# Patient Record
Sex: Female | Born: 1995 | Race: Black or African American | Hispanic: No | Marital: Single | Smoking: Former smoker
Health system: Southern US, Community
[De-identification: ages and names within clinical notes are randomized; demographics above are authoritative.]

## PROBLEM LIST (undated history)

## (undated) DIAGNOSIS — D649 Anemia, unspecified: Secondary | ICD-10-CM

## (undated) DIAGNOSIS — Z87440 Personal history of urinary (tract) infections: Secondary | ICD-10-CM

## (undated) HISTORY — DX: Personal history of urinary (tract) infections: Z87.440

## (undated) HISTORY — DX: Anemia, unspecified: D64.9

## (undated) HISTORY — PX: OTHER SURGICAL HISTORY: SHX169

---
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
## 2017-02-11 ENCOUNTER — Encounter: Payer: Self-pay | Admitting: *Deleted

## 2017-02-11 ENCOUNTER — Emergency Department
Admission: EM | Admit: 2017-02-11 | Discharge: 2017-02-11 | Disposition: A | Discharge status: Home / Self Care | Payer: Self-pay | Attending: Emergency Medicine | Admitting: Emergency Medicine

## 2017-02-11 DIAGNOSIS — R197 Diarrhea, unspecified: Secondary | ICD-10-CM | POA: Insufficient documentation

## 2017-02-11 DIAGNOSIS — R112 Nausea with vomiting, unspecified: Secondary | ICD-10-CM | POA: Insufficient documentation

## 2017-02-11 DIAGNOSIS — J069 Acute upper respiratory infection, unspecified: Secondary | ICD-10-CM | POA: Insufficient documentation

## 2017-02-11 DIAGNOSIS — Z3A00 Weeks of gestation of pregnancy not specified: Secondary | ICD-10-CM | POA: Insufficient documentation

## 2017-02-11 DIAGNOSIS — O99519 Diseases of the respiratory system complicating pregnancy, unspecified trimester: Secondary | ICD-10-CM | POA: Insufficient documentation

## 2017-02-11 MED ORDER — GUAIFENESIN ER 600 MG PO TB12: 1200.0000 mg | ORAL_TABLET | Freq: Two times a day (BID) | ORAL | 0 refills | Status: DC

## 2017-02-11 MED ORDER — DIPHENHYDRAMINE HCL 25 MG PO CAPS: 25.0000 mg | ORAL_CAPSULE | ORAL | 2 refills | Status: DC | PRN

## 2017-02-11 NOTE — ED Triage Notes (Signed)
Pt states nasal congestion, left ear pain and sore throat for 2 days, states she is pregnant, unsure of how long that her test was just positive, denies any pregnancy complaints of bleeding, states "I am just here for my cold", awake and alert in no acute distress

## 2017-02-11 NOTE — ED Provider Notes (Signed)
[facility] Emergency Department Provider Note  ____________________________________________  Time seen: Approximately 2:48 PM  I have reviewed the triage vital signs and the nursing notes.   HISTORY  Chief Complaint Nasal Congestion and Otalgia   HPI Deborah Ferguson is a 21 y.o. female who presents to the emergency department for nasal congestion, left ear pain, sore throat, chills, dizziness, and headache with nausea and fatigue. She has had 4 episodes of vomiting and 8 episodes of diarrhea. She has taken Nyquil without relief. She is pregnant, but not sure how far along.   History reviewed. No pertinent past medical history.  There are no active problems to display for this patient.   No past surgical history on file.  Prior to Admission medications   Medication Sig Start Date End Date Taking? Authorizing Provider  diphenhydrAMINE (BENADRYL) 25 mg capsule Take 1 capsule (25 mg total) by mouth every 4 (four) hours as needed. 02/11/17 02/11/18  Braxten Memmer, Kasandra Knudsenari B, FNP  guaiFENesin (MUCINEX) 600 MG 12 hr tablet Take 2 tablets (1,200 mg total) by mouth 2 (two) times daily. 02/11/17   Chinita Pesterriplett, Dorreen Valiente B, FNP    Allergies Patient has no known allergies.  History reviewed. No pertinent family history.  Social History Social History  Substance Use Topics  . Smoking status: Not on file  . Smokeless tobacco: Not on file  . Alcohol use Not on file    Review of Systems Constitutional: Positive fever/chills ENT: Positive sore throat. Cardiovascular: Denies chest pain. Respiratory: Negative for shortness of breath. Negative for cough. Gastrointestinal: Positive for nausea,  Positive for vomiting.  Positive for diarrhea.  Musculoskeletal: Positive for body aches Skin: Negataive for rash. Neurological: Positive for headaches ____________________________________________   PHYSICAL EXAM:  VITAL SIGNS: ED Triage Vitals  Enc Vitals Group     BP 02/11/17  1409 102/87     Pulse Rate 02/11/17 1409 73     Resp 02/11/17 1409 16     Temp 02/11/17 1409 98.3 F (36.8 C)     Temp Source 02/11/17 1409 Oral     SpO2 02/11/17 1409 99 %     Weight 02/11/17 1406 120 lb (54.4 kg)     Height 02/11/17 1406 5\' 5"  (1.651 m)     Head Circumference --      Peak Flow --      Pain Score 02/11/17 1406 8     Pain Loc --      Pain Edu? --      Excl. in GC? --     Constitutional: Alert and oriented. Acutely ill appearing and in no acute distress. Eyes: Conjunctivae are normal. EOMI. Ears: Left TM is mildly erythematous and bulging, right TM is normal Nose: Maxillary sinus congestion noted; without rhinnorhea. Mouth/Throat: Mucous membranes are moist.  Oropharynx with cobblestone exudate. Tonsils without exudate.. Neck: No stridor.  Lymphatic: No cervical lymphadenopathy. Cardiovascular: Normal rate, regular rhythm. Good peripheral circulation. Respiratory: Normal respiratory effort.  No retractions. Breath sounds clear to auscultation.. Gastrointestinal: Soft and nontender.  Musculoskeletal: FROM x 4 extremities.  Neurologic:  Normal speech and language.  Skin:  Skin is warm, dry and intact. No rash noted. Psychiatric: Mood and affect are normal. Speech and behavior are normal.  ____________________________________________   LABS (all labs ordered are listed, but only abnormal results are displayed)  Labs Reviewed - No data to display ____________________________________________  EKG   ____________________________________________  RADIOLOGY  Not indicated. ____________________________________________   PROCEDURES  Procedure(s) performed: None  Critical Care performed: No ____________________________________________   INITIAL IMPRESSION / ASSESSMENT AND PLAN / ED COURSE  21 year old female who presents to the emergency department for evaluation of multiple medical complaints. She recently had a positive home pregnancy test, but has  not had a gynecology exam. She denies abdominal pain or vaginal bleeding at this time. She will be treated for her URI symptoms and advised to follow up with the health department. She was advised to take tylenol if needed for headache. She has been able to tolerate food and fluids today without vomiting. Vital signs and exam are reassuring. She was advised to return to the ER for symptoms that change or worsen if unable to schedule an appointment.  Pertinent labs & imaging results that were available during my care of the patient were reviewed by me and considered in my medical decision making (see chart for details).  New Prescriptions   DIPHENHYDRAMINE (BENADRYL) 25 MG CAPSULE    Take 1 capsule (25 mg total) by mouth every 4 (four) hours as needed.   GUAIFENESIN (MUCINEX) 600 MG 12 HR TABLET    Take 2 tablets (1,200 mg total) by mouth 2 (two) times daily.    If controlled substance prescribed during this visit, 12 month history viewed on the [facility] prior to issuing an initial prescription for Schedule II or III opiod. ____________________________________________   FINAL CLINICAL IMPRESSION(S) / ED DIAGNOSES  Final diagnoses:  Upper respiratory tract infection, unspecified type    Note:  This document was prepared using Dragon voice recognition software and may include unintentional dictation errors.     Chinita Pester, FNP 02/11/17 1534    Minna Antis, MD 02/15/17 1419

## 2017-04-07 DIAGNOSIS — Z91410 Personal history of adult physical and sexual abuse: Secondary | ICD-10-CM | POA: Insufficient documentation

## 2017-04-07 LAB — OB RESULTS CONSOLE HIV ANTIBODY (ROUTINE TESTING): HIV: NONREACTIVE

## 2017-04-07 LAB — HM PAP SMEAR

## 2017-04-08 ENCOUNTER — Other Ambulatory Visit: Payer: Self-pay | Admitting: Advanced Practice Midwife

## 2017-04-08 DIAGNOSIS — Z3482 Encounter for supervision of other normal pregnancy, second trimester: Secondary | ICD-10-CM

## 2017-04-09 LAB — OB RESULTS CONSOLE ANTIBODY SCREEN: ANTIBODY SCREEN: NEGATIVE

## 2017-04-09 LAB — OB RESULTS CONSOLE GC/CHLAMYDIA
CHLAMYDIA, DNA PROBE: NEGATIVE
Gonorrhea: NEGATIVE

## 2017-04-09 LAB — OB RESULTS CONSOLE RUBELLA ANTIBODY, IGM: RUBELLA: IMMUNE

## 2017-04-09 LAB — OB RESULTS CONSOLE ABO/RH: RH Type: POSITIVE

## 2017-04-09 LAB — OB RESULTS CONSOLE VARICELLA ZOSTER ANTIBODY, IGG: VARICELLA IGG: IMMUNE

## 2017-04-10 ENCOUNTER — Ambulatory Visit
Admission: RE | Admit: 2017-04-10 | Discharge: 2017-04-10 | Disposition: A | Discharge status: Home / Self Care | Payer: Medicaid Other | Source: Ambulatory Visit | Attending: Advanced Practice Midwife | Admitting: Advanced Practice Midwife

## 2017-04-10 ENCOUNTER — Other Ambulatory Visit: Payer: Self-pay | Admitting: Advanced Practice Midwife

## 2017-04-10 DIAGNOSIS — Z3482 Encounter for supervision of other normal pregnancy, second trimester: Secondary | ICD-10-CM | POA: Insufficient documentation

## 2017-04-10 DIAGNOSIS — Z3A13 13 weeks gestation of pregnancy: Secondary | ICD-10-CM | POA: Insufficient documentation

## 2017-04-21 DIAGNOSIS — R87619 Unspecified abnormal cytological findings in specimens from cervix uteri: Secondary | ICD-10-CM | POA: Insufficient documentation

## 2017-04-30 ENCOUNTER — Other Ambulatory Visit: Payer: Self-pay | Admitting: Obstetrics & Gynecology

## 2017-04-30 DIAGNOSIS — Z363 Encounter for antenatal screening for malformations: Secondary | ICD-10-CM

## 2017-05-20 ENCOUNTER — Ambulatory Visit (INDEPENDENT_AMBULATORY_CARE_PROVIDER_SITE_OTHER): Payer: Medicaid Other

## 2017-05-20 DIAGNOSIS — Z363 Encounter for antenatal screening for malformations: Secondary | ICD-10-CM

## 2017-05-25 ENCOUNTER — Other Ambulatory Visit: Payer: Self-pay | Admitting: Advanced Practice Midwife

## 2017-05-25 DIAGNOSIS — Z3689 Encounter for other specified antenatal screening: Secondary | ICD-10-CM

## 2017-06-04 ENCOUNTER — Ambulatory Visit: Payer: MEDICAID

## 2017-06-04 ENCOUNTER — Institutional Professional Consult (permissible substitution): Payer: Self-pay

## 2017-06-11 ENCOUNTER — Encounter: Payer: Self-pay | Admitting: *Deleted

## 2017-06-11 ENCOUNTER — Ambulatory Visit
Admission: RE | Admit: 2017-06-11 | Discharge: 2017-06-11 | Disposition: A | Discharge status: Home / Self Care | Payer: Medicaid Other | Source: Ambulatory Visit | Attending: Obstetrics and Gynecology | Admitting: Obstetrics and Gynecology

## 2017-06-11 DIAGNOSIS — Z3A22 22 weeks gestation of pregnancy: Secondary | ICD-10-CM | POA: Insufficient documentation

## 2017-06-11 DIAGNOSIS — O368920 Maternal care for other specified fetal problems, second trimester, not applicable or unspecified: Secondary | ICD-10-CM | POA: Diagnosis present

## 2017-06-11 DIAGNOSIS — Z362 Encounter for other antenatal screening follow-up: Secondary | ICD-10-CM | POA: Diagnosis present

## 2017-06-11 DIAGNOSIS — Z3689 Encounter for other specified antenatal screening: Secondary | ICD-10-CM

## 2017-07-24 LAB — OB RESULTS CONSOLE HIV ANTIBODY (ROUTINE TESTING): HIV: NONREACTIVE

## 2017-07-24 LAB — HM HIV SCREENING LAB: HM HIV Screening: NEGATIVE

## 2017-07-25 LAB — OB RESULTS CONSOLE RPR: RPR: NONREACTIVE

## 2017-09-03 ENCOUNTER — Inpatient Hospital Stay: Payer: Medicaid Other | Admitting: Oncology

## 2017-09-03 NOTE — Progress Notes (Deleted)
  Hematology/Oncology Consult note [facility] Telephone:([phone] [phone]   Patient Care Team: System, Provider Not In as PCP - General  REFERRING PROVIDER:  CHIEF COMPLAINTS/PURPOSE OF CONSULTATION:    HISTORY OF PRESENTING ILLNESS:  Deborah Ferguson is a  21 y.o.  female with PMH listed below who was referred to me for evaluation of     ROS  MEDICAL HISTORY:  Past Medical History:  Diagnosis Date  . Medical history non-contributory     SURGICAL HISTORY: No past surgical history on file.  SOCIAL HISTORY: Social History   Socioeconomic History  . Marital status: Single    Spouse name: Not on file  . Number of children: Not on file  . Years of education: Not on file  . Highest education level: Not on file  Social Needs  . Financial resource strain: Not on file  . Food insecurity - worry: Not on file  . Food insecurity - inability: Not on file  . Transportation needs - medical: Not on file  . Transportation needs - non-medical: Not on file  Occupational History  . Not on file  Tobacco Use  . Smoking status: Never Smoker  . Smokeless tobacco: Never Used  Substance and Sexual Activity  . Alcohol use: No  . Drug use: No  . Sexual activity: Yes  Other Topics Concern  . Not on file  Social History Narrative  . Not on file    FAMILY HISTORY: Family History  Problem Relation Age of Onset  . Cancer Father     ALLERGIES:  has No Known Allergies.  MEDICATIONS:  Current Outpatient Medications  Medication Sig Dispense Refill  . diphenhydrAMINE (BENADRYL) 25 mg capsule Take 1 capsule (25 mg total) by mouth every 4 (four) hours as needed. (Patient not taking: Reported on 06/11/2017) 30 capsule 2  . guaiFENesin (MUCINEX) 600 MG 12 hr tablet Take 2 tablets (1,200 mg total) by mouth 2 (two) times daily. (Patient not taking: Reported on 06/11/2017) 28 tablet 0  . Prenatal Vit-Fe Fumarate-FA (PRENATAL MULTIVITAMIN) TABS tablet  Take 1 tablet by mouth daily at 12 noon.     No current facility-administered medications for this visit.      PHYSICAL EXAMINATION: ECOG PERFORMANCE STATUS: {CHL ONC ECOG [phone]} There were no vitals filed for this visit. There were no vitals filed for this visit.  Physical Exam   LABORATORY DATA:  I have reviewed the data as listed No results found for: WBC, HGB, HCT, MCV, PLT No results for input(s): NA, K, CL, CO2, GLUCOSE, BUN, CREATININE, CALCIUM, GFRNONAA, GFRAA, PROT, ALBUMIN, AST, ALT, ALKPHOS, BILITOT, BILIDIR, IBILI in the last 8760 hours.  Records from outside facility showed: 07/24/2017 TIBC 418, iron 58, Iron saturation 14%, ferritin 12 WBC 9.1, Hb 9.5, Hct 27.3, MCV 91, platelet 217    ASSESSMENT & PLAN:  1. Iron deficiency anemia, unspecified iron deficiency anemia type      All questions were answered. The patient knows to call the clinic with any problems questions or concerns.  Return of visit:  Thank you for this kind referral and the opportunity to participate in the care of this patient. A copy of today's note is routed to referring provider    Rickard PatienceZhou Solangel Mcmanaway, MD, PhD Hematology Oncology [facility] Pager- [phone] 09/03/2017

## 2017-09-14 ENCOUNTER — Inpatient Hospital Stay: Payer: Medicaid Other | Attending: Oncology | Admitting: Oncology

## 2017-09-14 ENCOUNTER — Telehealth: Payer: Self-pay | Admitting: Oncology

## 2017-09-14 NOTE — Telephone Encounter (Signed)
I talked to appt scheduler at [facility]. I informed her that Deborah Ferguson has missed 2 scheduled appt for a Consult and that the patient has not been seen at all. She told me that she misses her appts a lot and did not show up there for an appt today also. She is going to let the Nurse Midwife know.

## 2017-09-15 NOTE — L&D Delivery Note (Signed)
Operative Delivery Note  DATE OF OPERATION: 10/06/2017 PREOPERATIVE DIAGNOSES: 1. Term pregnancy. 2. Maternal exhaustion  POSTOPERATIVE DIAGNOSES: 1. Term pregnancy at 39 wks and 0 days Gestational Age 23. Live, Viable female infant 3. Maternal exhaustion 4. Shoulder dystocia 5. Postpartum hemorrhage  OPERATION PERFORMED: Vacuum-Assisted Vaginal Delivery SURGEON: Adelene Idlerhristanna Schuman, MD ANESTHESIA: Epidural ESTIMATED BLOOD LOSS: Please see medical record  FINDINGS: Delivered a live viable female infant weighing 8lbs 10oz with Apgars 2/7, three-vessel cord and normal placenta. Grossly normal uterus, fallopian tubes and ovaries.   COMPLICATIONS: Shoulder dystocia and postpartum hemorrhage  Situation: Patient had made slow, but steady change in the second stage of labor. She pushed for 2 and a half hours, but pushing was disorganized with the patient stopping pushes early after less than 10 seconds or not pushing with the nadir of the contraction. Patient stated that she was tired and needed a break. Discussed options for vacuum assisted delivery versus cesarean section. Patient wanted to push again, which she did for more than 5 contractions. Patient then wished to attempt vacuum delivery.   DESCRIPTION OF THE PROCEDURE:  Verbal consent: obtained from patient.  Risks and benefits discussed in detail.  Risks include, but are not limited to the risks of anesthesia, bleeding, infection, damage to maternal tissues, fetal cephalhematoma.  There is also the risk of inability to effect vaginal delivery of the head, or shoulder dystocia that cannot be resolved by established maneuvers, leading to the need for emergency cesarean section.  The baby's head was confirmed to be in the (occiput anterior presentation) with 100% effacement and +2 station. . The bladder was drained with a straight cath for 100cc of clear urine. The vacuum was placed and the correct placement in front of the posterior  fontanelle was confirmed digitally. With the patient's next contraction, the kiwi vacuum was inflated and a gentle downward pressure was used to assist with brining the baby's head to a +3 station. The contraction ended. The vacuum was released between contractions. This process was repeated for 4 contractions for a total of 4 pulls. The infant's head was delivered direct OA after the 4th pull. The vacuum suction was released and the The fetal head rotated to ROA.  No nuchal cord. Gentle downward guidance did not deliver the anterior shoulder.   Shoulder dystocia was identified, and announced. The emergency call button was pushed. The patient was positioned into [place]. Suprapubic pressure was applied from the maternal right side. First attempt made to deliver the posterior arm was unsuccessful. Episiotomy was made. The posterior arm was then delivered by Dr. Dalbert GarnetBeasley. The dystocia was released. The anterior shoulder delivered. The body followed spontaneously. The infant was placed on maternal abdomen. NICU attendant present at bedside. Baby stimulated. Pulse present. The cord was clamped and cut. Baby was taken to the warmer for resuscitation. Infant improved quickly with good tone and vigor. Movement of both arms and hands in warmer.   Arterial and Venous blood gas samples obtained from cord segment. Spontaneous delivery of intact placenta with 3 vessel cord. Patient began having brisk bleeding from the uterus, identified as hemorrhage. Uterine fundal massage performed. Exam did not show retained products. Cytotec given rectally. Glove changed. Methergine given IM. Bleeding resolved quickly and fundus was firm.   Episiotomy (3rd degree) was repaired with 2-0 and 3-0 Vicryl in the usual manner. Good hemostasis. Rectal examination at the conclusion of the repair did not show any suture in the rectum.    Mom and baby recovering  in stable condition. Baby skin-to-skin and breast feeding initiated.     Deborah Ferguson 10/06/2017, 12:44 PM

## 2017-09-24 LAB — OB RESULTS CONSOLE GBS: STREP GROUP B AG: POSITIVE

## 2017-09-29 ENCOUNTER — Encounter (INDEPENDENT_AMBULATORY_CARE_PROVIDER_SITE_OTHER): Payer: Self-pay

## 2017-09-29 ENCOUNTER — Other Ambulatory Visit: Payer: Self-pay

## 2017-09-29 ENCOUNTER — Other Ambulatory Visit: Payer: Self-pay | Admitting: *Deleted

## 2017-09-29 ENCOUNTER — Inpatient Hospital Stay: Payer: Medicaid Other | Attending: Oncology | Admitting: Oncology

## 2017-09-29 ENCOUNTER — Inpatient Hospital Stay: Payer: Medicaid Other

## 2017-09-29 ENCOUNTER — Encounter: Payer: Self-pay | Admitting: Oncology

## 2017-09-29 VITALS — BP 117/80 | HR 112 | Temp 96.8°F | Resp 16 | Wt 167.0 lb

## 2017-09-29 VITALS — BP 111/71 | HR 112 | Resp 18

## 2017-09-29 DIAGNOSIS — O99013 Anemia complicating pregnancy, third trimester: Secondary | ICD-10-CM | POA: Diagnosis not present

## 2017-09-29 DIAGNOSIS — D509 Iron deficiency anemia, unspecified: Secondary | ICD-10-CM

## 2017-09-29 LAB — CBC WITH DIFFERENTIAL/PLATELET
BASOS PCT: 0 %
Basophils Absolute: 0 10*3/uL (ref 0–0.1)
Eosinophils Absolute: 0 10*3/uL (ref 0–0.7)
Eosinophils Relative: 0 %
HEMATOCRIT: 29.3 % — AB (ref 35.0–47.0)
HEMOGLOBIN: 9.7 g/dL — AB (ref 12.0–16.0)
LYMPHS ABS: 1.1 10*3/uL (ref 1.0–3.6)
LYMPHS PCT: 12 %
MCH: 30.2 pg (ref 26.0–34.0)
MCHC: 33.2 g/dL (ref 32.0–36.0)
MCV: 91 fL (ref 80.0–100.0)
MONOS PCT: 8 %
Monocytes Absolute: 0.8 10*3/uL (ref 0.2–0.9)
NEUTROS ABS: 8 10*3/uL — AB (ref 1.4–6.5)
NEUTROS PCT: 80 %
Platelets: 247 10*3/uL (ref 150–440)
RBC: 3.22 MIL/uL — ABNORMAL LOW (ref 3.80–5.20)
RDW: 13.6 % (ref 11.5–14.5)
WBC: 10 10*3/uL (ref 3.6–11.0)

## 2017-09-29 LAB — RETICULOCYTES
RBC.: 3.22 MIL/uL — AB (ref 3.80–5.20)
RETIC COUNT ABSOLUTE: 77.3 10*3/uL (ref 19.0–183.0)
RETIC CT PCT: 2.4 % (ref 0.4–3.1)

## 2017-09-29 LAB — COMPREHENSIVE METABOLIC PANEL
ALBUMIN: 3 g/dL — AB (ref 3.5–5.0)
ALK PHOS: 200 U/L — AB (ref 38–126)
ALT: 11 U/L — ABNORMAL LOW (ref 14–54)
ANION GAP: 10 (ref 5–15)
AST: 23 U/L (ref 15–41)
BILIRUBIN TOTAL: 0.9 mg/dL (ref 0.3–1.2)
BUN: <5 mg/dL — ABNORMAL LOW (ref 6–20)
CALCIUM: 8.7 mg/dL — AB (ref 8.9–10.3)
CO2: 19 mmol/L — ABNORMAL LOW (ref 22–32)
Chloride: 106 mmol/L (ref 101–111)
Creatinine, Ser: 0.39 mg/dL — ABNORMAL LOW (ref 0.44–1.00)
GFR calc Af Amer: >60 mL/min (ref >60)
GFR calc non Af Amer: >60 mL/min (ref >60)
GLUCOSE: 136 mg/dL — AB (ref 65–99)
Potassium: 3.7 mmol/L (ref 3.5–5.1)
Sodium: 135 mmol/L (ref 135–145)
TOTAL PROTEIN: 6.8 g/dL (ref 6.5–8.1)

## 2017-09-29 LAB — TSH: TSH: 0.94 u[IU]/mL (ref 0.350–4.500)

## 2017-09-29 LAB — IRON AND TIBC
Iron: 41 ug/dL (ref 28–170)
Saturation Ratios: 8 % — ABNORMAL LOW (ref 10.4–31.8)
TIBC: 535 ug/dL — ABNORMAL HIGH (ref 250–450)
UIBC: 494 ug/dL

## 2017-09-29 LAB — VITAMIN B12: VITAMIN B 12: 154 pg/mL — AB (ref 180–914)

## 2017-09-29 LAB — FOLATE: Folate: 12.3 ng/mL (ref >5.9)

## 2017-09-29 MED ORDER — IRON SUCROSE 20 MG/ML IV SOLN
200.0000 mg | Freq: Once | INTRAVENOUS | Status: AC
  Administered 2017-09-29: 200 mg via INTRAVENOUS
  Filled 2017-09-29: qty 10

## 2017-09-29 MED ORDER — SODIUM CHLORIDE 0.9 % IV SOLN
Freq: Once | INTRAVENOUS | Status: AC
  Administered 2017-09-29: 15:00:00 via INTRAVENOUS
  Filled 2017-09-29: qty 1000

## 2017-09-29 NOTE — Progress Notes (Signed)
Hematology/Oncology Consult note [facility] [phone]) [phone] [phone] CBC with differential CMP Patient Care Team: Patient, No Pcp Per as PCP - General (General Practice)   Name of the patient: Deborah Ferguson  [id number]  August 21, 1996    Reason for referral- anemia third trimester of pregnancy   Referring physician- Dr. Arnetha CourserElizabeth Sciora  Date of visit: 09/29/17   History of presenting illness-patient is a 22 year old female currently at 7138 weeks of gestation.  She is G1P1L0.  She has had 2 prior abortions.  She was referred to us for anemia on last month in December 2018 but did not show up for 2 of her appointments.  She has a history of marijuana use from the age of 22 until at least June 2018.  Last hemoglobin from 07/24/2017 was 9.4 with a ferritin level of 12 and iron saturation of 14%.  TIBC was elevated at 418.  Platelet count was normal at 217.  Her urine in July 2018 was positive for cannabinoids.  Patient's hemoglobin from June 2018 was normal at 14.4.  It started drifting down to 11 in July and then to 9 in November 2018 when she started taking oral iron.  She is currently taking it twice a day for the last 1-09/9340-month.  She reports feeling fatigued but denies other complaints.  Denies any blood loss in her stool or urine.  With family history of colon cancer in her father at the age of 23  ECOG PS- 1  Pain scale- 0   Review of systems- Review of Systems  Constitutional: Positive for malaise/fatigue. Negative for chills, fever and weight loss.  HENT: Negative for congestion, ear discharge and nosebleeds.   Eyes: Negative for blurred vision.  Respiratory: Negative for cough, hemoptysis, sputum production, shortness of breath and wheezing.   Cardiovascular: Negative for chest pain, palpitations, orthopnea and claudication.  Gastrointestinal: Negative for abdominal pain, blood in stool, constipation, diarrhea, heartburn, melena, nausea  and vomiting.  Genitourinary: Negative for dysuria, flank pain, frequency, hematuria and urgency.  Musculoskeletal: Negative for back pain, joint pain and myalgias.  Skin: Negative for rash.  Neurological: Negative for dizziness, tingling, focal weakness, seizures, weakness and headaches.  Endo/Heme/Allergies: Does not bruise/bleed easily.  Psychiatric/Behavioral: Negative for depression and suicidal ideas. The patient does not have insomnia.     No Known Allergies  Patient Active Problem List   Diagnosis Date Noted  . Iron deficiency anemia 09/29/2017     Past Medical History:  Diagnosis Date  . Anemia   . Medical history non-contributory      History reviewed. No pertinent surgical history.  Social History   Socioeconomic History  . Marital status: Single    Spouse name: Not on file  . Number of children: Not on file  . Years of education: Not on file  . Highest education level: Not on file  Social Needs  . Financial resource strain: Not on file  . Food insecurity - worry: Not on file  . Food insecurity - inability: Not on file  . Transportation needs - medical: Not on file  . Transportation needs - non-medical: Not on file  Occupational History  . Not on file  Tobacco Use  . Smoking status: Never Smoker  . Smokeless tobacco: Never Used  Substance and Sexual Activity  . Alcohol use: No  . Drug use: No  . Sexual activity: Yes  Other Topics Concern  . Not on file  Social History Narrative  . Not on  file     Family History  Problem Relation Age of Onset  . Cancer Father      Current Outpatient Medications:  .  Prenatal Vit-Fe Fumarate-FA (PRENATAL MULTIVITAMIN) TABS tablet, Take 1 tablet by mouth daily at 12 noon., Disp: , Rfl:    Physical exam:  Vitals:   09/29/17 1400  BP: 117/80  Pulse: (!) 112  Resp: 16  Temp: (!) 96.8 F (36 C)  TempSrc: Tympanic  Weight: 167 lb (75.8 kg)   Physical Exam  Constitutional: She is oriented to person,  place, and time and well-developed, well-nourished, and in no distress.  HENT:  Head: Normocephalic and atraumatic.  Eyes: EOM are normal. Pupils are equal, round, and reactive to light.  Neck: Normal range of motion.  Cardiovascular: Regular rhythm and normal heart sounds.  tachycardic  Pulmonary/Chest: Effort normal and breath sounds normal.  Abdominal: Soft. Bowel sounds are normal.  Gravid uterus  Neurological: She is alert and oriented to person, place, and time.  Skin: Skin is warm and dry.      Assessment and plan- Patient is a 22 y.o. female referred for anemia and third trimester of pregnancy  Today I will do a complete anemia workup including CBC with differential, CMP, ferritin and iron studies, B12, folate, reticulocyte count, haptoglobin,TSH.  Based on the results of her blood work I will decide about IV iron and if she needs any B12 shots.  Patient is already at 38 weeks of pregnancy and very close to her delivery.  Even if she gets IV iron and B12 it typically takes 2 months for her to take effect and we do not have enough time to bring her hemoglobin up sufficiently before her delivery.  So if she does have significant anemia at the time of delivery she may need blood transfusion as IV iron would not have taken effect  Based on her labs from November 2018 there was evidence of iron deficiency back then.  I will therefore proceed with 1 dose of Venofer today and if labs from today are consistent with iron deficiency she will get 3 more doses of Venofer over the next 2 weeks prior to her delivery.  I will see her back in 4 weeks time with CBC ferritin and iron studies  I discussed risks and benefits of IV iron including all but not limited to headache, leg swelling, risk of infusion reactions.  Patient understands and agrees to proceed as planned   Thank you for this kind referral and the opportunity to participate in the care of this patient   Visit Diagnosis 1. Anemia  affecting pregnancy in third trimester   2. Iron deficiency anemia, unspecified iron deficiency anemia type     Dr. Owens Shark, MD, MPH [facility] Pager- [phone] 09/29/2017 2:30 PM

## 2017-09-30 ENCOUNTER — Telehealth: Payer: Self-pay | Admitting: *Deleted

## 2017-09-30 ENCOUNTER — Other Ambulatory Visit: Payer: Self-pay | Admitting: Oncology

## 2017-09-30 LAB — HAPTOGLOBIN: HAPTOGLOBIN: 55 mg/dL (ref 34–200)

## 2017-09-30 LAB — FERRITIN: FERRITIN: 7 ng/mL — AB (ref 11–307)

## 2017-09-30 NOTE — Telephone Encounter (Signed)
Left VM message for patient to start taking PO vitamin B12, 1000 mcg daily, per Dr. Assunta Gamblesao's request due to low B12 level. Also informed her that we would give her B12 injections each time she comes for her IV Venofer.       dhs

## 2017-10-02 ENCOUNTER — Inpatient Hospital Stay: Payer: Medicaid Other

## 2017-10-02 VITALS — BP 106/73 | HR 108 | Temp 98.0°F | Resp 18

## 2017-10-02 DIAGNOSIS — D509 Iron deficiency anemia, unspecified: Secondary | ICD-10-CM

## 2017-10-02 DIAGNOSIS — O99013 Anemia complicating pregnancy, third trimester: Secondary | ICD-10-CM | POA: Diagnosis not present

## 2017-10-02 MED ORDER — IRON SUCROSE 20 MG/ML IV SOLN
200.0000 mg | Freq: Once | INTRAVENOUS | Status: AC
  Administered 2017-10-02: 200 mg via INTRAVENOUS
  Filled 2017-10-02: qty 10

## 2017-10-02 MED ORDER — SODIUM CHLORIDE 0.9 % IV SOLN
INTRAVENOUS | Status: DC
  Administered 2017-10-02: 14:00:00 via INTRAVENOUS
  Filled 2017-10-02: qty 1000

## 2017-10-05 ENCOUNTER — Inpatient Hospital Stay
Admission: EM | Admit: 2017-10-05 | Discharge: 2017-10-08 | DRG: 768 | Disposition: A | Discharge status: Home / Self Care | Payer: Medicaid Other | Attending: Obstetrics and Gynecology | Admitting: Obstetrics and Gynecology

## 2017-10-05 ENCOUNTER — Inpatient Hospital Stay: Payer: Medicaid Other | Admitting: Anesthesiology

## 2017-10-05 ENCOUNTER — Other Ambulatory Visit: Payer: Self-pay

## 2017-10-05 ENCOUNTER — Encounter: Payer: Self-pay | Admitting: *Deleted

## 2017-10-05 ENCOUNTER — Inpatient Hospital Stay: Payer: Medicaid Other

## 2017-10-05 DIAGNOSIS — O99824 Streptococcus B carrier state complicating childbirth: Secondary | ICD-10-CM | POA: Diagnosis present

## 2017-10-05 DIAGNOSIS — O26893 Other specified pregnancy related conditions, third trimester: Secondary | ICD-10-CM | POA: Diagnosis present

## 2017-10-05 DIAGNOSIS — Z8759 Personal history of other complications of pregnancy, childbirth and the puerperium: Secondary | ICD-10-CM

## 2017-10-05 DIAGNOSIS — Z87891 Personal history of nicotine dependence: Secondary | ICD-10-CM

## 2017-10-05 DIAGNOSIS — O99324 Drug use complicating childbirth: Secondary | ICD-10-CM | POA: Diagnosis present

## 2017-10-05 DIAGNOSIS — F1290 Cannabis use, unspecified, uncomplicated: Secondary | ICD-10-CM | POA: Diagnosis present

## 2017-10-05 DIAGNOSIS — D509 Iron deficiency anemia, unspecified: Secondary | ICD-10-CM | POA: Diagnosis present

## 2017-10-05 DIAGNOSIS — O9902 Anemia complicating childbirth: Principal | ICD-10-CM | POA: Diagnosis present

## 2017-10-05 DIAGNOSIS — Z3A38 38 weeks gestation of pregnancy: Secondary | ICD-10-CM

## 2017-10-05 LAB — URINE DRUG SCREEN, QUALITATIVE (ARMC ONLY)
AMPHETAMINES, UR SCREEN: NOT DETECTED
BARBITURATES, UR SCREEN: NOT DETECTED
Benzodiazepine, Ur Scrn: NOT DETECTED
Cannabinoid 50 Ng, Ur ~~LOC~~: NOT DETECTED
Cocaine Metabolite,Ur ~~LOC~~: NOT DETECTED
MDMA (Ecstasy)Ur Screen: NOT DETECTED
METHADONE SCREEN, URINE: NOT DETECTED
Opiate, Ur Screen: NOT DETECTED
Phencyclidine (PCP) Ur S: NOT DETECTED
TRICYCLIC, UR SCREEN: NOT DETECTED

## 2017-10-05 LAB — CBC
HCT: 32.2 % — ABNORMAL LOW (ref 35.0–47.0)
Hemoglobin: 10.7 g/dL — ABNORMAL LOW (ref 12.0–16.0)
MCH: 30.5 pg (ref 26.0–34.0)
MCHC: 33.2 g/dL (ref 32.0–36.0)
MCV: 91.7 fL (ref 80.0–100.0)
PLATELETS: 235 10*3/uL (ref 150–440)
RBC: 3.51 MIL/uL — AB (ref 3.80–5.20)
RDW: 14.2 % (ref 11.5–14.5)
WBC: 11.7 10*3/uL — AB (ref 3.6–11.0)

## 2017-10-05 LAB — TYPE AND SCREEN
ABO/RH(D): A POS
Antibody Screen: NEGATIVE

## 2017-10-05 LAB — CHLAMYDIA/NGC RT PCR (ARMC ONLY)
CHLAMYDIA TR: NOT DETECTED
N GONORRHOEAE: NOT DETECTED

## 2017-10-05 MED ORDER — LIDOCAINE-EPINEPHRINE (PF) 1.5 %-1:200000 IJ SOLN
INTRAMUSCULAR | Status: DC | PRN
  Administered 2017-10-05 – 2017-10-06 (×2): 3 mL via EPIDURAL

## 2017-10-05 MED ORDER — AMMONIA AROMATIC IN INHA
RESPIRATORY_TRACT | Status: AC
  Filled 2017-10-05: qty 10

## 2017-10-05 MED ORDER — LACTATED RINGERS IV SOLN
500.0000 mL | INTRAVENOUS | Status: DC | PRN
  Administered 2017-10-06: 500 mL via INTRAVENOUS

## 2017-10-05 MED ORDER — ONDANSETRON HCL 4 MG/2ML IJ SOLN
4.0000 mg | Freq: Four times a day (QID) | INTRAMUSCULAR | Status: DC | PRN
  Administered 2017-10-06: 4 mg via INTRAVENOUS
  Filled 2017-10-05: qty 2

## 2017-10-05 MED ORDER — LIDOCAINE HCL (PF) 1 % IJ SOLN
INTRAMUSCULAR | Status: DC | PRN
  Administered 2017-10-05 – 2017-10-06 (×3): 1 mL via INTRADERMAL

## 2017-10-05 MED ORDER — OXYTOCIN BOLUS FROM INFUSION
500.0000 mL | Freq: Once | INTRAVENOUS | Status: AC
  Administered 2017-10-06: 500 mL via INTRAVENOUS

## 2017-10-05 MED ORDER — FENTANYL CITRATE (PF) 100 MCG/2ML IJ SOLN
50.0000 ug | INTRAMUSCULAR | Status: DC | PRN
  Administered 2017-10-05 – 2017-10-06 (×3): 100 ug via INTRAVENOUS
  Filled 2017-10-05 (×3): qty 2

## 2017-10-05 MED ORDER — PENICILLIN G POTASSIUM 5000000 UNITS IJ SOLR
5.0000 10*6.[IU] | Freq: Once | INTRAVENOUS | Status: AC
  Administered 2017-10-05: 5 10*6.[IU] via INTRAVENOUS
  Filled 2017-10-05: qty 5

## 2017-10-05 MED ORDER — PENICILLIN G POT IN DEXTROSE 60000 UNIT/ML IV SOLN
3.0000 10*6.[IU] | INTRAVENOUS | Status: DC
  Administered 2017-10-05 – 2017-10-06 (×6): 3 10*6.[IU] via INTRAVENOUS
  Filled 2017-10-05: qty 0
  Filled 2017-10-05 (×10): qty 50

## 2017-10-05 MED ORDER — EPHEDRINE 5 MG/ML INJ
10.0000 mg | INTRAVENOUS | Status: DC | PRN
  Filled 2017-10-05: qty 2

## 2017-10-05 MED ORDER — OXYTOCIN 10 UNIT/ML IJ SOLN
INTRAMUSCULAR | Status: AC
  Filled 2017-10-05: qty 2

## 2017-10-05 MED ORDER — MISOPROSTOL 200 MCG PO TABS
ORAL_TABLET | ORAL | Status: AC
  Administered 2017-10-06: 800 ug via RECTAL
  Filled 2017-10-05: qty 4

## 2017-10-05 MED ORDER — ACETAMINOPHEN 325 MG PO TABS: 650.0000 mg | ORAL_TABLET | ORAL | Status: DC | PRN

## 2017-10-05 MED ORDER — FENTANYL 2.5 MCG/ML W/ROPIVACAINE 0.15% IN NS 100 ML EPIDURAL (ARMC)
EPIDURAL | Status: AC
  Filled 2017-10-05: qty 100

## 2017-10-05 MED ORDER — PHENYLEPHRINE 40 MCG/ML (10ML) SYRINGE FOR IV PUSH (FOR BLOOD PRESSURE SUPPORT)
80.0000 ug | PREFILLED_SYRINGE | INTRAVENOUS | Status: DC | PRN
  Filled 2017-10-05: qty 5

## 2017-10-05 MED ORDER — LACTATED RINGERS IV SOLN
500.0000 mL | Freq: Once | INTRAVENOUS | Status: AC
  Administered 2017-10-05: 500 mL via INTRAVENOUS

## 2017-10-05 MED ORDER — LIDOCAINE HCL (PF) 1 % IJ SOLN
INTRAMUSCULAR | Status: AC
  Filled 2017-10-05: qty 30

## 2017-10-05 MED ORDER — LACTATED RINGERS IV SOLN
INTRAVENOUS | Status: DC
  Administered 2017-10-05: 10:00:00 via INTRAVENOUS

## 2017-10-05 MED ORDER — OXYTOCIN 40 UNITS IN LACTATED RINGERS INFUSION - SIMPLE MED
2.5000 [IU]/h | INTRAVENOUS | Status: DC
  Administered 2017-10-06: 2.5 [IU]/h via INTRAVENOUS

## 2017-10-05 MED ORDER — DIPHENHYDRAMINE HCL 50 MG/ML IJ SOLN: 12.5000 mg | INTRAMUSCULAR | Status: DC | PRN

## 2017-10-05 MED ORDER — OXYTOCIN 40 UNITS IN LACTATED RINGERS INFUSION - SIMPLE MED
INTRAVENOUS | Status: AC
  Administered 2017-10-06: 500 mL via INTRAVENOUS
  Filled 2017-10-05: qty 1000

## 2017-10-05 MED ORDER — SODIUM CHLORIDE 0.9 % IV SOLN
INTRAVENOUS | Status: DC | PRN
  Administered 2017-10-05 – 2017-10-06 (×4): 5 mL via EPIDURAL

## 2017-10-05 MED ORDER — FENTANYL 2.5 MCG/ML W/ROPIVACAINE 0.15% IN NS 100 ML EPIDURAL (ARMC)
12.0000 mL/h | EPIDURAL | Status: DC
  Administered 2017-10-05: 12 mL/h via EPIDURAL
  Filled 2017-10-05: qty 100

## 2017-10-05 NOTE — Anesthesia Procedure Notes (Signed)
Epidural Patient location during procedure: OB Start time: 10/05/2017 5:03 PM End time: 10/05/2017 5:16 PM  Staffing Anesthesiologist: Nakota Ackert, Cleda MccreedyJoseph K, MD Performed: anesthesiologist   Preanesthetic Checklist Completed: patient identified, site marked, surgical consent, pre-op evaluation, timeout performed, IV checked, risks and benefits discussed and monitors and equipment checked  Epidural Patient position: sitting Prep: Betadine Patient monitoring: heart rate, continuous pulse ox and blood pressure Approach: midline Location: L4-L5 Injection technique: LOR saline  Needle:  Needle type: Tuohy  Needle gauge: 17 G Needle length: 9 cm and 9 Needle insertion depth: 6.5 cm Catheter type: closed end flexible Catheter size: 19 Gauge Catheter at skin depth: 11 cm Test dose: negative and 1.5% lidocaine with Epi 1:200 K  Assessment Sensory level: T10 Events: blood not aspirated, injection not painful, no injection resistance, negative IV test and no paresthesia  Additional Notes 2 attempts.  Transient left sided paresthesia that resolved with needle retraction and re direction.  Brisk loss of resistance to saline but then resistance with epidural catheter placement at 2 different levels. Pt. Evaluated and documentation done after procedure finished. Patient identified. Risks/Benefits/Options discussed with patient including but not limited to bleeding, infection, nerve damage, paralysis, failed block, incomplete pain control, headache, blood pressure changes, nausea, vomiting, reactions to medication both or allergic, itching and postpartum back pain. Confirmed with bedside nurse the patient's most recent platelet count. Confirmed with patient that they are not currently taking any anticoagulation, have any bleeding history or any family history of bleeding disorders. Patient expressed understanding and wished to proceed. All questions were answered. Sterile technique was used  throughout the entire procedure. Please see nursing notes for vital signs. Test dose was given through epidural catheter and negative prior to continuing to dose epidural or start infusion. Warning signs of high block given to the patient including shortness of breath, tingling/numbness in hands, complete motor block, or any concerning symptoms with instructions to call for help. Patient was given instructions on fall risk and not to get out of bed. All questions and concerns addressed with instructions to call with any issues or inadequate analgesia.   Patient tolerated the insertion well without immediate complications.Reason for block:procedure for pain

## 2017-10-05 NOTE — OB Triage Note (Signed)
Pt. Presented to ED with vaginal bleeding and pelvic pain this morning at approximately 0300. Pelvic pain is intermittent for about 3 minutes. States she has not had sex in the last 24 hours. Patient states that patient noticed bleeding when she wiped after using the restroom. Patient denies having any vaginal discharge.

## 2017-10-05 NOTE — Anesthesia Preprocedure Evaluation (Signed)
Anesthesia Evaluation  Patient identified by MRN, date of birth, ID band Patient awake    Reviewed: Allergy & Precautions, H&P , NPO status , Patient's Chart, lab work & pertinent test results  History of Anesthesia Complications Negative for: history of anesthetic complications  Airway Mallampati: III  TM Distance: >3 FB Neck ROM: full    Dental  (+) Chipped   Pulmonary former smoker,           Cardiovascular Exercise Tolerance: Good (-) hypertensionnegative cardio ROS       Neuro/Psych    GI/Hepatic negative GI ROS,   Endo/Other    Renal/GU   negative genitourinary   Musculoskeletal   Abdominal   Peds  Hematology negative hematology ROS (+)   Anesthesia Other Findings Past Medical History: No date: Anemia  History reviewed. No pertinent surgical history.  BMI    Body Mass Index:  27.79 kg/m      Reproductive/Obstetrics (+) Pregnancy                             Anesthesia Physical Anesthesia Plan  ASA: II  Anesthesia Plan: Epidural   Post-op Pain Management:    Induction:   PONV Risk Score and Plan:   Airway Management Planned:   Additional Equipment:   Intra-op Plan:   Post-operative Plan:   Informed Consent: I have reviewed the patients History and Physical, chart, labs and discussed the procedure including the risks, benefits and alternatives for the proposed anesthesia with the patient or authorized representative who has indicated his/her understanding and acceptance.     Plan Discussed with: Anesthesiologist  Anesthesia Plan Comments:         Anesthesia Quick Evaluation
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
## 2017-10-05 NOTE — Progress Notes (Signed)
MD Piscitello notified via phone that pt has c/o 10/10 pain with contractions. Epidural PCA has been used per pt multiple times. Upon assessment, pt without level noted from epidural anesthesia. Pt with sensitivity to cold over all of abdomen to thigh. Orders received. Will hold epidural infusion. See MAR.

## 2017-10-05 NOTE — Progress Notes (Signed)
  Labor Progress Note   22 y.o. [id number] @ 7566w6d , admitted for  Pregnancy, Labor Management. Spontaneous labor.  Subjective:  Resting in bed. Comfortable and breathing through contractions.  Objective:  BP 122/72   Pulse (!) 105   Temp 97.8 F (36.6 C) (Oral)   Ht 5\' 5"  (1.651 m)   Wt 167 lb (75.8 kg)   LMP 12/27/2016   BMI 27.79 kg/m  Abd: mild Extr: trace to 1+ bilateral pedal edema SVE: EXTERNAL GENITALIA: normal appearing vulva with no masses, tenderness or lesions CERVIX: 5 cm dilated, 90% effaced/-1 VAGINA: no abnormalities noted MEMBRANES: ruptured (AROM), clear fluid  EFM: FHR: 150 bpm, variability: moderate,  accelerations:  Present,  decelerations:  Absent Toco: Frequency: Every 1-4 minutes, Duration: 60-90 seconds and Intensity: moderate Labs: I have reviewed the patient's lab results.   Assessment & Plan:  [id number] @ 5566w6d, admitted for  Pregnancy and Labor/Delivery Management  1. Pain management: coping at present, epidural when ready. 2. FWB: FHT category I.  3. ID: GBS positive 4. Labor management: Continue antibiotics for GBS prophylaxis, epidural at patient's request, anticipate SVD.  All discussed with patient, see orders.  Marcelyn BruinsJacelyn Parks Czajkowski, CNM 10/05/2017  4:25 PM

## 2017-10-06 ENCOUNTER — Encounter: Payer: Self-pay | Admitting: *Deleted

## 2017-10-06 DIAGNOSIS — Z3A38 38 weeks gestation of pregnancy: Secondary | ICD-10-CM

## 2017-10-06 DIAGNOSIS — O99824 Streptococcus B carrier state complicating childbirth: Secondary | ICD-10-CM

## 2017-10-06 DIAGNOSIS — Z8759 Personal history of other complications of pregnancy, childbirth and the puerperium: Secondary | ICD-10-CM

## 2017-10-06 LAB — CBC
HEMATOCRIT: 25.2 % — AB (ref 35.0–47.0)
Hemoglobin: 8.3 g/dL — ABNORMAL LOW (ref 12.0–16.0)
MCH: 30.4 pg (ref 26.0–34.0)
MCHC: 32.8 g/dL (ref 32.0–36.0)
MCV: 92.5 fL (ref 80.0–100.0)
Platelets: 191 10*3/uL (ref 150–440)
RBC: 2.72 MIL/uL — ABNORMAL LOW (ref 3.80–5.20)
RDW: 14.3 % (ref 11.5–14.5)
WBC: 20.4 10*3/uL — AB (ref 3.6–11.0)

## 2017-10-06 LAB — RPR: RPR Ser Ql: NONREACTIVE

## 2017-10-06 MED ORDER — WITCH HAZEL-GLYCERIN EX PADS
1.0000 "application " | MEDICATED_PAD | CUTANEOUS | Status: DC | PRN
  Administered 2017-10-06: 1 via TOPICAL
  Filled 2017-10-06: qty 100

## 2017-10-06 MED ORDER — OXYCODONE HCL 5 MG PO TABS: 5.0000 mg | ORAL_TABLET | ORAL | Status: DC | PRN

## 2017-10-06 MED ORDER — COCONUT OIL OIL: 1.0000 "application " | TOPICAL_OIL | Status: DC | PRN

## 2017-10-06 MED ORDER — DIPHENHYDRAMINE HCL 25 MG PO CAPS: 25.0000 mg | ORAL_CAPSULE | Freq: Four times a day (QID) | ORAL | Status: DC | PRN

## 2017-10-06 MED ORDER — SENNOSIDES-DOCUSATE SODIUM 8.6-50 MG PO TABS
2.0000 | ORAL_TABLET | ORAL | Status: DC
  Administered 2017-10-08: 2 via ORAL
  Filled 2017-10-06 (×2): qty 2

## 2017-10-06 MED ORDER — IBUPROFEN 600 MG PO TABS
600.0000 mg | ORAL_TABLET | Freq: Four times a day (QID) | ORAL | Status: DC
  Administered 2017-10-06 – 2017-10-08 (×8): 600 mg via ORAL
  Filled 2017-10-06 (×8): qty 1

## 2017-10-06 MED ORDER — PRENATAL MULTIVITAMIN CH
1.0000 | ORAL_TABLET | Freq: Every day | ORAL | Status: DC
  Administered 2017-10-06 – 2017-10-08 (×3): 1 via ORAL
  Filled 2017-10-06 (×3): qty 1

## 2017-10-06 MED ORDER — OXYTOCIN 40 UNITS IN LACTATED RINGERS INFUSION - SIMPLE MED
1.0000 m[IU]/min | INTRAVENOUS | Status: DC
  Administered 2017-10-06: 2 m[IU]/min via INTRAVENOUS
  Filled 2017-10-06: qty 1000

## 2017-10-06 MED ORDER — ACETAMINOPHEN 325 MG PO TABS
650.0000 mg | ORAL_TABLET | ORAL | Status: DC | PRN
  Administered 2017-10-06: 650 mg via ORAL
  Filled 2017-10-06: qty 2

## 2017-10-06 MED ORDER — ONDANSETRON HCL 4 MG PO TABS: 4.0000 mg | ORAL_TABLET | ORAL | Status: DC | PRN

## 2017-10-06 MED ORDER — BENZOCAINE-MENTHOL 20-0.5 % EX AERO
1.0000 "application " | INHALATION_SPRAY | CUTANEOUS | Status: DC | PRN
  Administered 2017-10-06: 1 via TOPICAL
  Filled 2017-10-06: qty 56

## 2017-10-06 MED ORDER — OXYCODONE HCL 5 MG PO TABS: 10.0000 mg | ORAL_TABLET | ORAL | Status: DC | PRN

## 2017-10-06 MED ORDER — LACTATED RINGERS IV BOLUS (SEPSIS): 1000.0000 mL | Freq: Once | INTRAVENOUS | Status: DC

## 2017-10-06 MED ORDER — SIMETHICONE 80 MG PO CHEW: 80.0000 mg | CHEWABLE_TABLET | ORAL | Status: DC | PRN

## 2017-10-06 MED ORDER — TERBUTALINE SULFATE 1 MG/ML IJ SOLN: 0.2500 mg | Freq: Once | INTRAMUSCULAR | Status: DC | PRN

## 2017-10-06 MED ORDER — METHYLERGONOVINE MALEATE 0.2 MG/ML IJ SOLN
INTRAMUSCULAR | Status: AC
  Administered 2017-10-06: 0.2 mg
  Filled 2017-10-06: qty 1

## 2017-10-06 MED ORDER — DIBUCAINE 1 % RE OINT: 1.0000 "application " | TOPICAL_OINTMENT | RECTAL | Status: DC | PRN

## 2017-10-06 MED ORDER — ONDANSETRON HCL 4 MG/2ML IJ SOLN: 4.0000 mg | INTRAMUSCULAR | Status: DC | PRN

## 2017-10-06 MED ORDER — MISOPROSTOL 200 MCG PO TABS
800.0000 ug | ORAL_TABLET | Freq: Once | ORAL | Status: AC
  Administered 2017-10-06: 800 ug via RECTAL

## 2017-10-06 MED FILL — Penicillin G Potassium Inj 60000 Unit/ML in Dextrose: INTRAVENOUS | Qty: 50 | Status: AC

## 2017-10-06 NOTE — Discharge Summary (Signed)
OB Discharge Summary     Patient Name: Deborah Ferguson DOB: 28-Aug-1996 MRN: [id number]  Date of admission: 10/05/2017 Delivering MD: Adelene Idler  Date of Delivery: 10/06/2017  Date of discharge: 10/08/2017  Admitting diagnosis: 39 weeks- bleeding and cramping Intrauterine pregnancy: [redacted]w[redacted]d     Secondary diagnosis: Anemia     Discharge diagnosis: Term Pregnancy Delivered                                                                                                Post partum procedures: None  Augmentation: Pitocin, AROM  Complications: vacuum assisted vaginal delivery, shoulder dystocia, episiotomy, 3rd degree perineal laceration, postpartum hemorrhage  Hospital course:  Onset of Labor With Vaginal Delivery      22 y.o. yo G3P0020 at [redacted]w[redacted]d was admitted in Active Labor on 10/05/2017.  Patient had labor course as follows:  Membrane Rupture Time/Date: 3:22 PM ,10/05/2017   Patient had delivery of viable female 11:19 AM, 10/06/2017 Details of delivery can be found in a separate delivery note  Patient had an uncomplicated postpartum course.   She is ambulating, tolerating a regular diet, passing flatus, and urinating well.  Patient is discharged home in stable condition on 10/08/2017   Physical exam  Vitals:   10/07/17 0922 10/07/17 1709 10/08/17 0038 10/08/17 0841  BP: 115/66 113/73 122/61 115/74  Pulse: 86 80 81 91  Resp:  18 18 18   Temp: 97.8 F (36.6 C)  (!) 97.5 F (36.4 C) 97.8 F (36.6 C)  TempSrc: Oral  Oral Oral  SpO2: 100% 100%  100%  Weight:      Height:       General: alert, cooperative and no distress Lochia: appropriate, rubra small Uterine Fundus: firm, U/1 Laceration: Well-approximated, no significant erythema DVT Evaluation: No evidence of DVT seen on physical exam.  Labs: Lab Results  Component Value Date   WBC 9.7 10/08/2017   HGB 7.1 (L) 10/08/2017   HCT 21.7 (L) 10/08/2017   MCV 92.8 10/08/2017   PLT 174 10/08/2017    Discharge  instruction: per After Visit Summary.  Medications:  Allergies as of 10/08/2017   No Known Allergies     Medication List    STOP taking these medications   ferrous sulfate 325 (65 FE) MG EC tablet Replaced by:  ferrous sulfate 325 (65 FE) MG tablet     TAKE these medications   ascorbic acid 500 MG tablet Commonly known as:  VITAMIN C Take 1 tablet (500 mg total) by mouth 2 (two) times daily before lunch and supper.   docusate sodium 100 MG capsule Commonly known as:  COLACE Take 1 capsule (100 mg total) by mouth daily.   ferrous sulfate 325 (65 FE) MG tablet Take 1 tablet (325 mg total) by mouth 2 (two) times daily with a meal. Replaces:  ferrous sulfate 325 (65 FE) MG EC tablet   medroxyPROGESTERone 150 MG/ML injection Commonly known as:  DEPO-PROVERA Inject 1 mL (150 mg total) into the muscle every 3 (three) months.   prenatal multivitamin Tabs tablet Take 1 tablet by mouth daily at 12 noon.  senna-docusate 8.6-50 MG tablet Commonly known as:  Senokot-S Take 2 tablets by mouth daily. Start taking on:  10/09/2017       Diet: routine diet  Activity: Advance as tolerated. Pelvic rest for 6 weeks.   Outpatient follow up: Follow-up Information    Department, [facility]. Schedule an appointment as soon as possible for a visit in 6 week(s).   Why:  postpartum follow up appointment Contact information: [address] [place] [phone]             Postpartum contraception: Depo Provera Rhogam Given postpartum: NA Rubella vaccine given postpartum: no Varicella vaccine given postpartum: no TDaP given antepartum or postpartum: given antepartum  Newborn Data: Live born female Shearon StallsKyndra Birth Weight:  8 pounds 10 ounces, 3912 g APGAR: 2, 7  Newborn Delivery   Birth date/time:  10/06/2017 11:19:00 Delivery type:  Vacuum assisted Vaginal      Baby Feeding: Bottle  Disposition:home with  mother  SIGNED:  Oswaldo ConroyJacelyn Y Schmid, CNM 10/08/2017 9:31 AM

## 2017-10-06 NOTE — Anesthesia Procedure Notes (Signed)
Epidural Patient location during procedure: OB Start time: 10/06/2017 5:03 AM End time: 10/06/2017 5:07 AM  Staffing Anesthesiologist: Piscitello, Cleda MccreedyJoseph K, MD Performed: anesthesiologist   Preanesthetic Checklist Completed: patient identified, site marked, surgical consent, pre-op evaluation, timeout performed, IV checked, risks and benefits discussed and monitors and equipment checked  Epidural Patient position: sitting Prep: Betadine Patient monitoring: heart rate, continuous pulse ox and blood pressure Approach: midline Location: L3-L4 Injection technique: LOR saline  Needle:  Needle type: Tuohy  Needle gauge: 17 G Needle length: 9 cm and 9 Needle insertion depth: 5.5 cm Catheter type: closed end flexible Catheter size: 19 Gauge Catheter at skin depth: 10.5 cm Test dose: negative and 1.5% lidocaine with Epi 1:200 K  Assessment Sensory level: T10 Events: blood not aspirated, injection not painful, no injection resistance, negative IV test and no paresthesia  Additional Notes 1 attempt Pt. Evaluated and documentation done after procedure finished. Patient identified. Risks/Benefits/Options discussed with patient including but not limited to bleeding, infection, nerve damage, paralysis, failed block, incomplete pain control, headache, blood pressure changes, nausea, vomiting, reactions to medication both or allergic, itching and postpartum back pain. Confirmed with bedside nurse the patient's most recent platelet count. Confirmed with patient that they are not currently taking any anticoagulation, have any bleeding history or any family history of bleeding disorders. Patient expressed understanding and wished to proceed. All questions were answered. Sterile technique was used throughout the entire procedure. Please see nursing notes for vital signs. Test dose was given through epidural catheter and negative prior to continuing to dose epidural or start infusion. Warning signs of  high block given to the patient including shortness of breath, tingling/numbness in hands, complete motor block, or any concerning symptoms with instructions to call for help. Patient was given instructions on fall risk and not to get out of bed. All questions and concerns addressed with instructions to call with any issues or inadequate analgesia.   Patient tolerated the insertion well without immediate complications.Reason for block:procedure for pain

## 2017-10-06 NOTE — Progress Notes (Signed)
  Labor Progress Note   22 y.o. L2G4010G3P0020 @ 742w0d , admitted for  Pregnancy, Labor Management.   Subjective:  Called to see patient around [id number]. She had remained at 8.5 cm despite Pitocin augmentation and IV pain medication was no longer providing pain control; she rated her pain 10/10. After discussion with patient and MD, patient decided to have epidural catheter replaced. Now comfortable with epidural.  Objective:  BP 128/67   Pulse (!) 101   Temp 98.2 F (36.8 C) (Axillary)   Resp 20   Ht 5\' 5"  (1.651 m)   Wt 167 lb (75.8 kg)   LMP 12/27/2016   SpO2 100%   BMI 27.79 kg/m  Abd: moderate Extr: trace to 1+ bilateral pedal edema SVE: EXTERNAL GENITALIA: normal appearing vulva with no masses, tenderness or lesions CERVIX: 9 cm dilated, 100 effaced, 0 station, presenting part vertex MEMBRANES: ruptured, clear fluid  EFM: FHR: 150 bpm, variability: moderate,  accelerations:  Present,  decelerations:  Absent Toco: Frequency: Every 2-3 minutes, Duration:60-100 seconds and Intensity: moderate Labs: I have reviewed the patient's lab results.   Assessment & Plan:  [id number] @ 9542w0d, admitted for  Pregnancy and Labor/Delivery Management  1. Pain management: epidural. 2. FWB: FHT category I.  3. ID: GBS positive 4. Labor management: IUPC in place, continue Pitocin augmentation, continue GBS prophylaxis.  All discussed with patient, see orders.  Marcelyn BruinsJacelyn Schmid, CNM 10/06/2017  6:21 AM

## 2017-10-06 NOTE — Lactation Note (Signed)
This note was copied from a baby's chart. Lactation Consultation Note  Patient Name: Deborah Ferguson Today's Date: 10/06/2017     Maternal Data Formula Feeding for Exclusion: Yes(Ifeoluwa Beller, LC, tried to see patient, but pt denied.) Reason for exclusion: (Patient states she "wants to bottle feed now")  Feeding Feeding Type: Breast Fed Length of feed: 10 min  LATCH Score                   Interventions    Lactation Tools Discussed/Used     Consult Status   Pt. Declines help from [facility] wants to only formula feed.   Burnadette PeterJaniya M Abshir Paolini 10/06/2017, 10:15 PM

## 2017-10-07 ENCOUNTER — Inpatient Hospital Stay: Payer: Medicaid Other

## 2017-10-07 DIAGNOSIS — O99324 Drug use complicating childbirth: Secondary | ICD-10-CM

## 2017-10-07 DIAGNOSIS — F1290 Cannabis use, unspecified, uncomplicated: Secondary | ICD-10-CM

## 2017-10-07 DIAGNOSIS — D509 Iron deficiency anemia, unspecified: Secondary | ICD-10-CM

## 2017-10-07 DIAGNOSIS — O9902 Anemia complicating childbirth: Secondary | ICD-10-CM

## 2017-10-07 DIAGNOSIS — O99824 Streptococcus B carrier state complicating childbirth: Secondary | ICD-10-CM

## 2017-10-07 LAB — CBC
HCT: 22.2 % — ABNORMAL LOW (ref 35.0–47.0)
Hemoglobin: 7.2 g/dL — ABNORMAL LOW (ref 12.0–16.0)
MCH: 30.2 pg (ref 26.0–34.0)
MCHC: 32.7 g/dL (ref 32.0–36.0)
MCV: 92.4 fL (ref 80.0–100.0)
Platelets: 174 10*3/uL (ref 150–440)
RBC: 2.4 MIL/uL — AB (ref 3.80–5.20)
RDW: 14.6 % — AB (ref 11.5–14.5)
WBC: 15.6 10*3/uL — AB (ref 3.6–11.0)

## 2017-10-07 MED ORDER — VITAMIN C 500 MG PO TABS
500.0000 mg | ORAL_TABLET | Freq: Two times a day (BID) | ORAL | Status: DC
  Administered 2017-10-07 – 2017-10-08 (×3): 500 mg via ORAL
  Filled 2017-10-07 (×3): qty 1

## 2017-10-07 MED ORDER — FERROUS SULFATE 325 (65 FE) MG PO TABS
325.0000 mg | ORAL_TABLET | Freq: Two times a day (BID) | ORAL | Status: DC
  Administered 2017-10-07 – 2017-10-08 (×3): 325 mg via ORAL
  Filled 2017-10-07 (×3): qty 1

## 2017-10-07 MED ORDER — MEDROXYPROGESTERONE ACETATE 150 MG/ML IM SUSP
150.0000 mg | INTRAMUSCULAR | Status: DC
  Administered 2017-10-08: 150 mg via INTRAMUSCULAR
  Filled 2017-10-07: qty 1

## 2017-10-07 MED ORDER — DOCUSATE SODIUM 100 MG PO CAPS
100.0000 mg | ORAL_CAPSULE | Freq: Every day | ORAL | Status: DC
  Administered 2017-10-07 – 2017-10-08 (×2): 100 mg via ORAL
  Filled 2017-10-07 (×2): qty 1

## 2017-10-07 NOTE — Progress Notes (Signed)
Post Partum Day 1  Subjective: Patient is alert for visit but fell asleep during postpartum care instructions. Doing well, no complaints. Tolerating regular diet, pain with PO meds, voiding and ambulating without difficulty.  No CP SOB F/C N/V or leg pain. Patient denies any symptoms associated with anemia. No lightheaded or dizzy feeling. She states "I feel fine". She was initially breastfeeding but now she wants to formula feed.    Objective: BP 116/63 (BP Location: Right Arm)   Pulse 97   Temp 97.7 F (36.5 C) (Oral)   Resp 18   Ht 5\' 5"  (1.651 m)   Wt 167 lb (75.8 kg)   LMP 12/27/2016   SpO2 100%  BMI 27.79 kg/m    Physical Exam:  General: NAD CV: RRR Pulm: nl effort, CTABL Lochia: moderate Uterine Fundus: fundus firm and below umbilicus DVT Evaluation: no cords, ttp LEs   Recent Labs    10/06/17 1802 10/07/17 0527  HGB 8.3* 7.2*  HCT 25.2* 22.2*  WBC 20.4* 15.6*  PLT 191 174    Assessment/Plan: 21 y.o. A2Z3086G3P1021 postpartum day #   1. Continue routine postpartum care 2. A positive, Rubella Immune, Varicella Immune 3. TDAP given antepartum 4. Formula feeding/Contraception: undecided 5. Disposition: Discharge to home tomorrow  Tresea MallJane Sanjuan Sawa, CNM

## 2017-10-07 NOTE — Clinical Social Work Maternal (Signed)
  CLINICAL SOCIAL WORK MATERNAL/CHILD NOTE  Patient Details  Name: Deborah Ferguson MRN: [id number] Date of Birth: 1995-10-17  Date:  10/07/2017  Clinical Social Worker Initiating Note:  Shela Leff MSW,LCSW Date/Time: Initiated:  10/07/17/      Child's Name:      Biological Parents:  Mother, Father   Need for Interpreter:  None   Reason for Referral:  (hx of marijuana use)   Address:  [address] [place]  [place] [address]    Phone number:  [phone] (home)     Additional phone number: none  Household Members/Support Persons (HM/SP):       HM/SP Name Relationship DOB or Age  HM/SP -1        HM/SP -2        HM/SP -3        HM/SP -4        HM/SP -5        HM/SP -6        HM/SP -7        HM/SP -8          Natural Supports (not living in the home):  Extended Family   Professional Supports: None   Employment:     Type of Work:     Education:      Homebound arranged:    Pensions consultant:  Medicaid   Other Resources:  [employer], Physicist, medical    Cultural/Religious Considerations Which May Impact Care:  none  Strengths:  Ability to meet basic needs , Home prepared for child    Psychotropic Medications:         Pediatrician:       Pediatrician List:   [place]      Pediatrician Fax Number:    Risk Factors/Current Problems:  None   Cognitive State:  Alert , Able to Concentrate    Mood/Affect:  Calm    CSW Assessment: CSW consulted for history of marijuana use. Last known marijuana use was from 04/07/17. Both urine drug screens for mom and newborn were negative on this admission. CSW met with patient and her significant other was at bedside sleeping. Patient was holding her newborn. This is her first child. She stated that she has all necessities for her newborn. She has no concerns regarding transportation and is connected with [employer] and food stamps.  Patient states that they are going to live with her sister and her sister's 2 children. Patient is aware of postpartum depression because she states a current family member has it. She is aware to contact her physician if symptoms should arise. Patient has not used marijuana since 7/24 of last year.   CSW Plan/Description:  No Further Intervention Required/No Barriers to Discharge    Shela Leff, LCSW 10/07/2017, 12:07 PM

## 2017-10-07 NOTE — Anesthesia Postprocedure Evaluation (Signed)
Anesthesia Post Note  Patient: Deborah Ferguson N Eskenazi  Procedure(s) Performed: AN AD HOC LABOR EPIDURAL  Patient location during evaluation: Mother Baby Anesthesia Type: Epidural Level of consciousness: awake and alert Pain management: pain level controlled Vital Signs Assessment: post-procedure vital signs reviewed and stable Respiratory status: spontaneous breathing, nonlabored ventilation and respiratory function stable Cardiovascular status: stable Postop Assessment: no headache, no backache and epidural receding Anesthetic complications: no     Last Vitals:  Vitals:   10/06/17 2343 10/07/17 0247  BP: 118/64 116/63  Pulse: (!) 107 97  Resp: 18 18  Temp: 36.7 C 36.5 C  SpO2: 100% 100%    Last Pain:  Vitals:   10/07/17 0617  TempSrc:   PainSc: 0-No pain                 [name],  Adley Mazurowski A

## 2017-10-07 NOTE — Progress Notes (Signed)
Post Partum Day 1 s/p VAVD/ shoulder dystocia/ PPH/third degree laceration Subjective: Denies being lightheaded when she gets up. Voiding without difficulty. Bleeding is slowing. Saw hematologist 09/29/2017 and received a dose of IV iron then.  Objective: Blood pressure 115/66, pulse 86, temperature 97.8 F (36.6 C), temperature source Oral, resp. rate 18, height 5\' 5"  (1.651 m), weight 75.8 kg (167 lb), last menstrual period 12/27/2016, SpO2 100 %, unknown if currently breastfeeding.  Physical Exam:  General: alert, cooperative and no distress Lochia: appropriate Uterine Fundus: firm/ U-1/ML/NT Perineum: C+D+I DVT Evaluation: No evidence of DVT seen on physical exam.  Recent Labs    10/06/17 1802 10/07/17 0527  HGB 8.3* 7.2*  HCT 25.2* 22.2*  WBC 20.4* 15.6*  PLT 191 174    Assessment/Plan: PPD #1-stable Chronic iron deficiency anemia worsened by acute blood loss  Asymptomatic and hemodynamically stable  Start ferrous sulfate and ascorbic acid  Safety precautions explained  Repeat CBC in AM Third degree laceration: start sitz baths today. Added another colace to her Senokot-s A POSitive/RI/VI/ TDAP UTD BT Contraception: ?Depo    LOS: 2 days   Farrel ConnersColleen Tyshawn Keel 10/07/2017, 10:03 AM

## 2017-10-08 DIAGNOSIS — O99824 Streptococcus B carrier state complicating childbirth: Secondary | ICD-10-CM

## 2017-10-08 DIAGNOSIS — O9902 Anemia complicating childbirth: Secondary | ICD-10-CM

## 2017-10-08 DIAGNOSIS — O99324 Drug use complicating childbirth: Secondary | ICD-10-CM

## 2017-10-08 DIAGNOSIS — D509 Iron deficiency anemia, unspecified: Secondary | ICD-10-CM

## 2017-10-08 DIAGNOSIS — F1290 Cannabis use, unspecified, uncomplicated: Secondary | ICD-10-CM

## 2017-10-08 LAB — CBC
HCT: 21.7 % — ABNORMAL LOW (ref 35.0–47.0)
Hemoglobin: 7.1 g/dL — ABNORMAL LOW (ref 12.0–16.0)
MCH: 30.5 pg (ref 26.0–34.0)
MCHC: 32.9 g/dL (ref 32.0–36.0)
MCV: 92.8 fL (ref 80.0–100.0)
PLATELETS: 174 10*3/uL (ref 150–440)
RBC: 2.34 MIL/uL — ABNORMAL LOW (ref 3.80–5.20)
RDW: 15 % — AB (ref 11.5–14.5)
WBC: 9.7 10*3/uL (ref 3.6–11.0)

## 2017-10-08 MED ORDER — SENNOSIDES-DOCUSATE SODIUM 8.6-50 MG PO TABS: 2.0000 | ORAL_TABLET | ORAL | 0 refills | Status: DC

## 2017-10-08 MED ORDER — ASCORBIC ACID 500 MG PO TABS: 500.0000 mg | ORAL_TABLET | Freq: Two times a day (BID) | ORAL | 3 refills | Status: DC

## 2017-10-08 MED ORDER — FERROUS SULFATE 325 (65 FE) MG PO TABS: 325.0000 mg | ORAL_TABLET | Freq: Two times a day (BID) | ORAL | 3 refills | Status: DC

## 2017-10-08 MED ORDER — MEDROXYPROGESTERONE ACETATE 150 MG/ML IM SUSP: 150.0000 mg | INTRAMUSCULAR | 3 refills | Status: DC

## 2017-10-08 MED ORDER — DOCUSATE SODIUM 100 MG PO CAPS: 100.0000 mg | ORAL_CAPSULE | Freq: Every day | ORAL | 0 refills | Status: DC

## 2017-10-08 NOTE — Progress Notes (Signed)
Patient educated about safe sleep numerous times during this shift.  Patient found sleeping with infant in bed at 19:30, 22:45, 00:58, 04:56.  Each time infant removed from bed and put back into crib.  Each time mother put infant back in bed with her and went back to sleep.

## 2017-10-08 NOTE — Progress Notes (Signed)
Patient discharged home with infant. Discharge instructions, prescriptions and follow up appointment given to and reviewed with patient. Patient verbalized understanding. Patient wheeled out with infant by auxiliary.  

## 2017-10-22 ENCOUNTER — Encounter: Payer: Self-pay | Admitting: Advanced Practice Midwife

## 2017-10-26 ENCOUNTER — Other Ambulatory Visit: Payer: Self-pay | Admitting: *Deleted

## 2017-10-26 ENCOUNTER — Other Ambulatory Visit: Payer: Self-pay

## 2017-10-26 DIAGNOSIS — D509 Iron deficiency anemia, unspecified: Secondary | ICD-10-CM

## 2017-10-27 ENCOUNTER — Inpatient Hospital Stay: Payer: Medicaid Other

## 2017-10-27 ENCOUNTER — Inpatient Hospital Stay: Payer: Medicaid Other | Admitting: Oncology

## 2017-11-19 ENCOUNTER — Ambulatory Visit: Payer: Medicaid Other | Admitting: Obstetrics and Gynecology

## 2017-11-24 ENCOUNTER — Telehealth: Payer: Self-pay

## 2017-11-24 NOTE — Telephone Encounter (Signed)
Called pt. Message states #has been changed or disconnected.

## 2017-11-24 NOTE — Telephone Encounter (Signed)
Pt states she delivered ~7 wks ago. She stopped bleeding but she started back and it is heavy. Inquiring if this is normal. [phone]#[phone]

## 2017-11-30 ENCOUNTER — Ambulatory Visit: Payer: Self-pay | Admitting: Obstetrics and Gynecology

## 2017-12-07 ENCOUNTER — Ambulatory Visit (INDEPENDENT_AMBULATORY_CARE_PROVIDER_SITE_OTHER): Payer: Medicaid Other | Admitting: Obstetrics and Gynecology

## 2017-12-07 ENCOUNTER — Encounter: Payer: Self-pay | Admitting: Obstetrics and Gynecology

## 2017-12-07 DIAGNOSIS — Z3202 Encounter for pregnancy test, result negative: Secondary | ICD-10-CM | POA: Diagnosis not present

## 2017-12-07 DIAGNOSIS — N939 Abnormal uterine and vaginal bleeding, unspecified: Secondary | ICD-10-CM

## 2017-12-07 DIAGNOSIS — Z8759 Personal history of other complications of pregnancy, childbirth and the puerperium: Secondary | ICD-10-CM

## 2017-12-07 DIAGNOSIS — Z8742 Personal history of other diseases of the female genital tract: Secondary | ICD-10-CM

## 2017-12-07 LAB — POCT URINE PREGNANCY: PREG TEST UR: NEGATIVE

## 2017-12-07 NOTE — Progress Notes (Signed)
  OBSTETRICS POSTPARTUM CLINIC PROGRESS NOTE  Subjective:     Deborah Ferguson is a 22 y.o. [phone] female who presents for a postpartum visit. She is 8 weeks postpartum following a Term pregnancy and delivery by VAVD with shoulder dystocia and episiotomy.  I have fully reviewed the prenatal and intrapartum course. Anesthesia: epidural.  Postpartum course has been complicated by complicated by persistant daily vaginal bleeding and anemia.  Baby is feeding by Bottle.  Bleeding: patient has not  resumed menses.  Bowel function is normal. Bladder function is normal.  Patient is sexually active. Contraception method desired is Depo-Provera injections.  Postpartum depression screening: negative. [place] 0.  Deborah Ferguson reports that her infant, Deborah Ferguson, is doing well after shoulder dystocia and has normal use and movement of her arm. Deborah Ferguson reports that she missed physical therapy for Deborah Ferguson recently because of car issues and I let her know that medicaid can help with arranging transportation if this is an issue in the future.   The following portions of the patient's history were reviewed and updated as appropriate: allergies, current medications, past family history, past medical history, past social history, past surgical history and problem list  Review of Systems A comprehensive review of systems was negative.  Objective:    BP 100/60   Pulse 94   Ht 5\' 5"  (1.651 m)   Wt 131 lb (59.4 kg)   Breastfeeding? No   BMI 21.80 kg/m   General:  alert and no distress   Breasts:  inspection negative, no nipple discharge or bleeding, no masses or nodularity palpable  Lungs: clear to auscultation bilaterally  Heart:  regular rate and rhythm, S1, S2 normal, no murmur, click, rub or gallop  Abdomen: soft, non-tender; bowel sounds normal; no masses,  no organomegaly.    Vulva:  normal  Vagina: normal vagina, no discharge, exudate, lesion, or erythema  Cervix:  no cervical motion tenderness and no  lesions. Small dark red blood noted in the vaginal vault. No active bleeding seen.   Corpus: normal size, contour, position, consistency, mobility, non-tender  Adnexa:  normal adnexa and no mass, fullness, tenderness  Rectal Exam: Not performed.          Assessment:  22 yo with persistent vaginal bleeding after delivery.   Plan:   21yo with Abnormal uterine bleeding 8 weeks postpartum. Urine pregnancy test today in office was negative. Will obtain a transvaginal US to evaluate for any retained products of conception. Bleeding could likely be secondary to Depo Provera initiation.  Follow up in: 1 week or as needed.   [name] MD [facility], [facility] 12/07/2017  3:45 PM

## 2017-12-15 ENCOUNTER — Other Ambulatory Visit: Payer: Medicaid Other

## 2017-12-15 ENCOUNTER — Ambulatory Visit: Payer: Medicaid Other | Admitting: Obstetrics and Gynecology

## 2018-05-15 IMAGING — US US OB COMP LESS 14 WK
1 series · 14 of 28 positions shown · non-contrast
Comparison: None.

CLINICAL DATA: Dating, supervision of normal pregnancy

EXAM:
OBSTETRIC <14 WK ULTRASOUND
TECHNIQUE: Transabdominal ultrasound was performed for evaluation of the
gestation as well as the maternal uterus and adnexal regions.

[Series 1: us ob comp less 14 wk · 0.15mm/px · 14 of 52 slices shown]
[im 2/52]
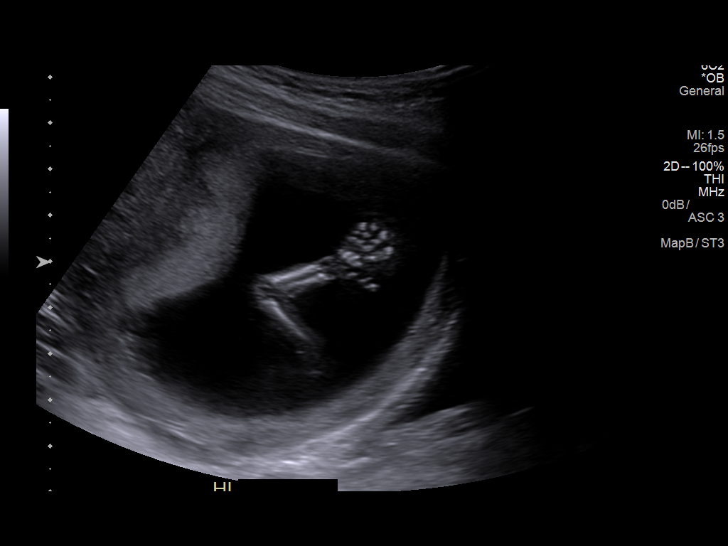
[im 6/52]
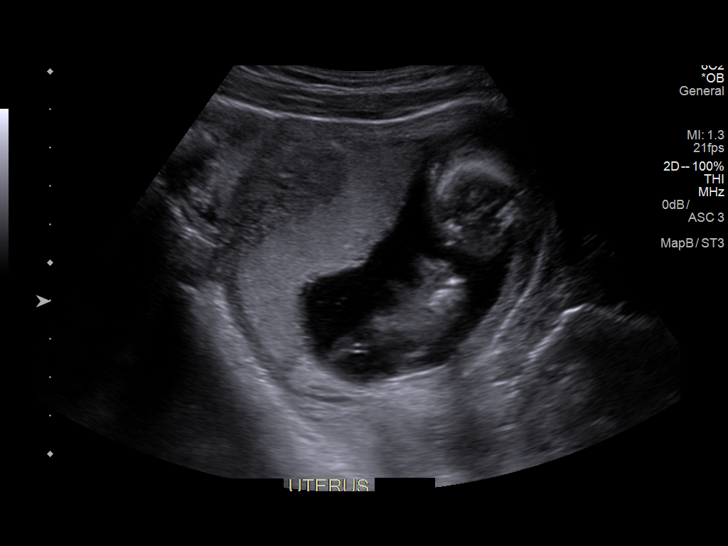
[im 10/52]
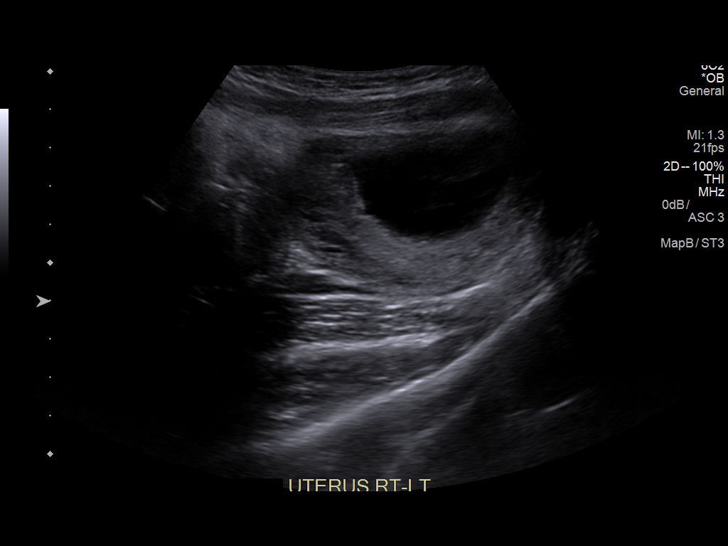
[im 14/52]
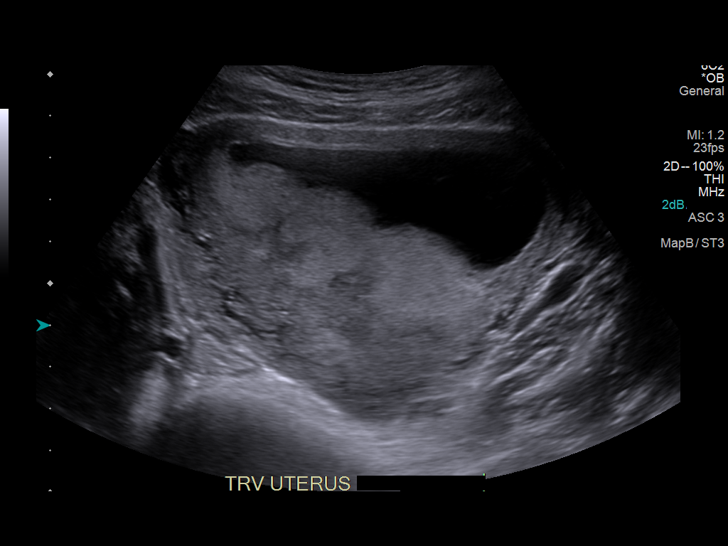
[im 18/52]
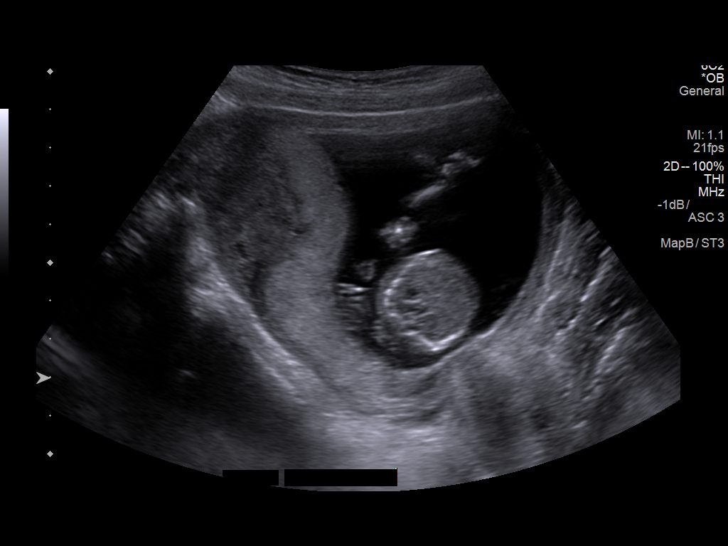
[im 21/52]
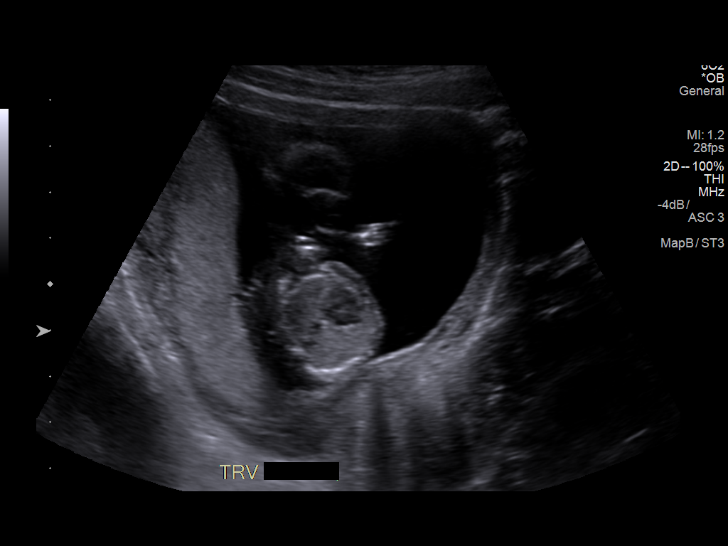
[im 25/52]
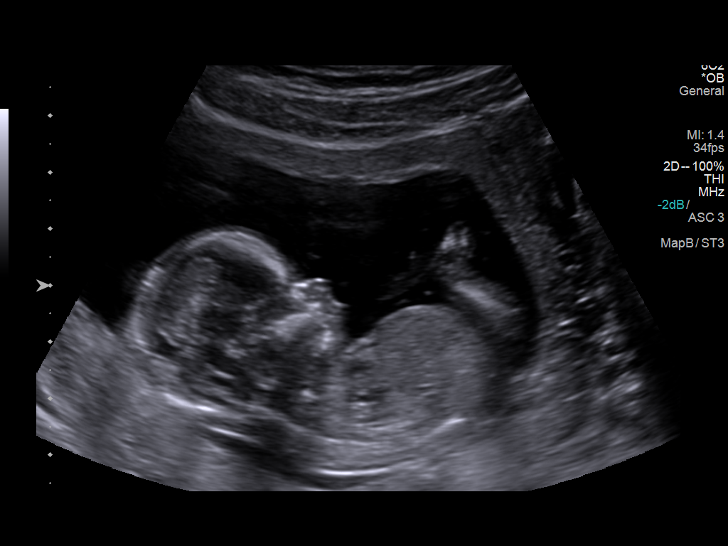
[im 29/52]
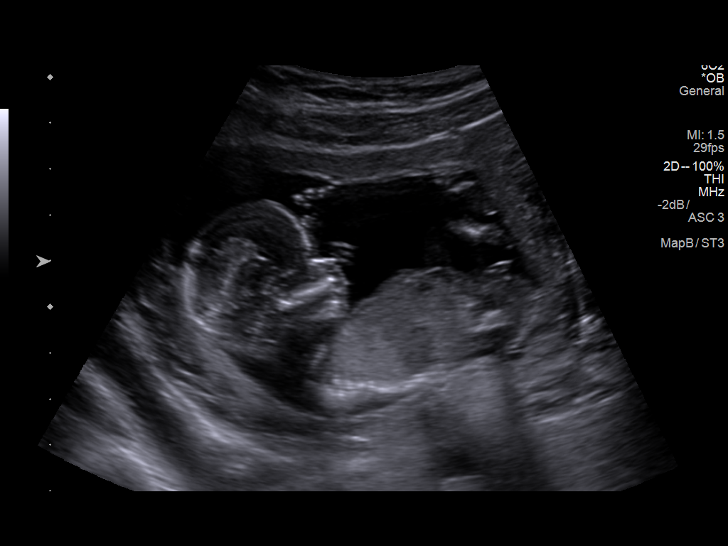
[im 33/52]
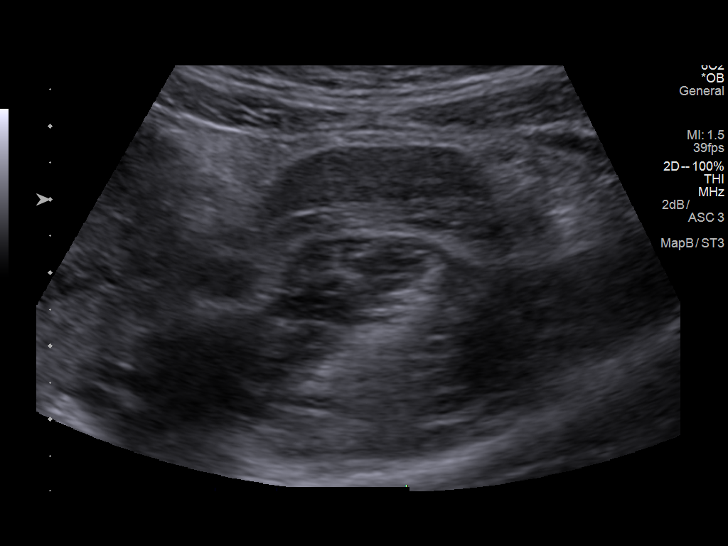
[im 36/52]
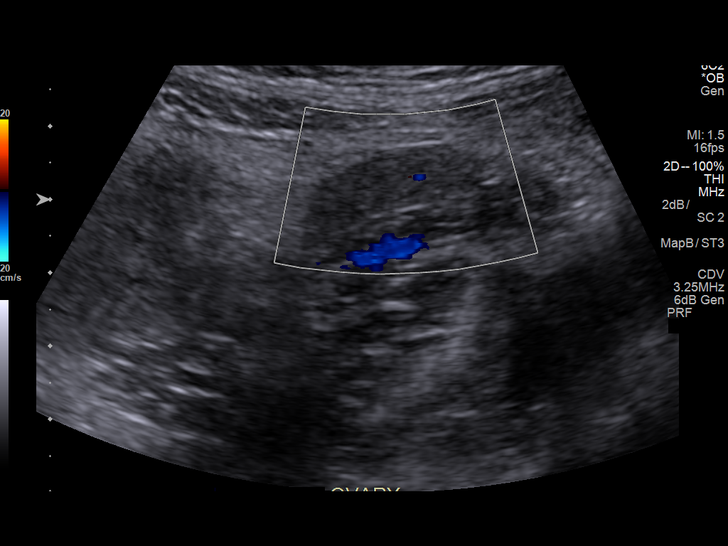
[im 40/52]
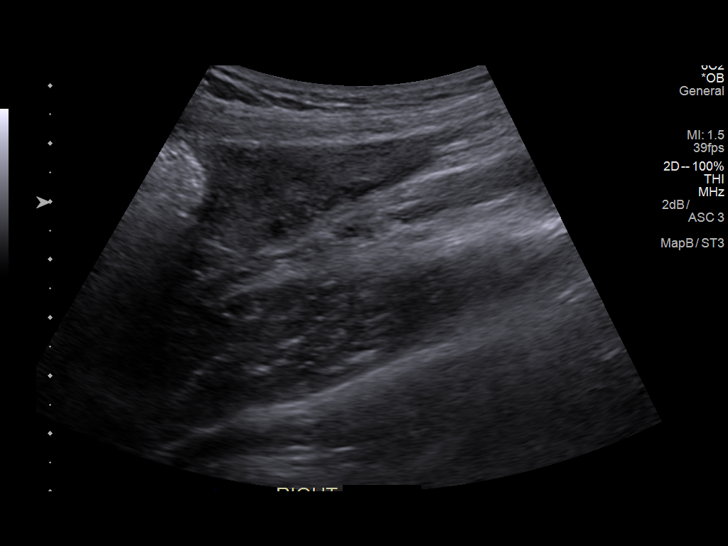
[im 44/52]
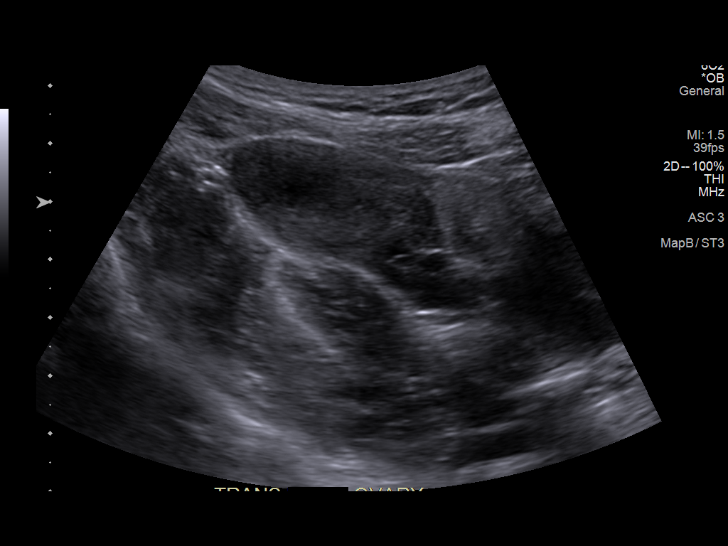
[im 48/52]
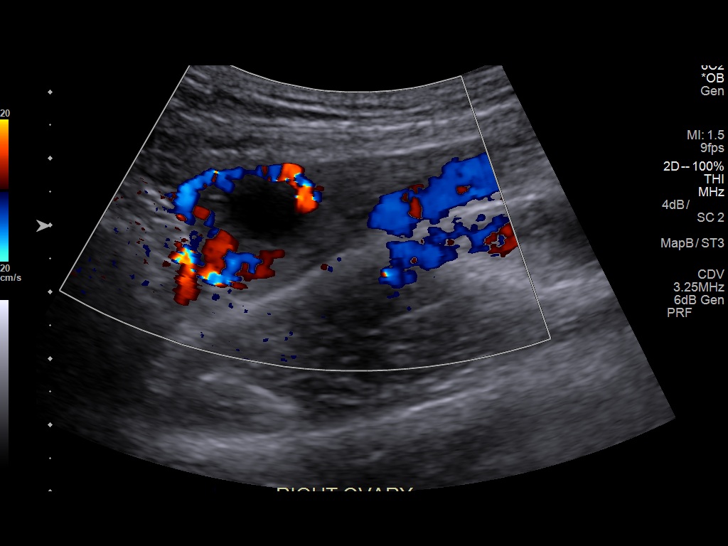
[im 52/52]
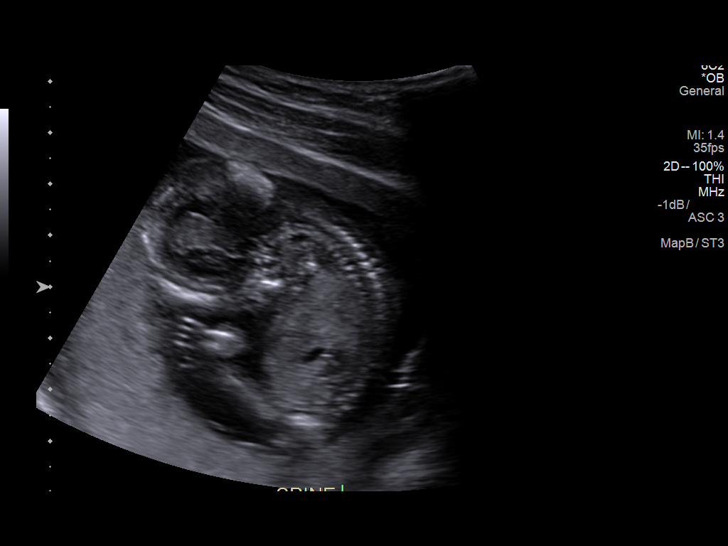

[14 of 28 positions shown; findings below may reference images not displayed]

FINDINGS: Intrauterine gestational sac: Present

Yolk sac:  Not identified

Embryo:  Present

Cardiac Activity: Present

Heart Rate: 178 bpm

CRL:   73.8  mm   13 w 3 d                  US EDC: 10/13/2017

Subchorionic hemorrhage:  None visualized.

Maternal uterus/adnexae:

LEFT ovary normal size and morphology 2.7 x 0.8 x 1.9 cm.

RIGHT ovary measures 4.2 x 1.9 x 1.9 cm and contains a small corpus
luteum.

No free pelvic fluid or adnexal masses.
IMPRESSION: Single live intrauterine gestation as above.

No acute abnormalities.

## 2018-07-28 ENCOUNTER — Telehealth: Payer: Self-pay

## 2019-08-19 DIAGNOSIS — Z91410 Personal history of adult physical and sexual abuse: Secondary | ICD-10-CM

## 2019-08-19 DIAGNOSIS — R87612 Low grade squamous intraepithelial lesion on cytologic smear of cervix (LGSIL): Secondary | ICD-10-CM

## 2019-08-22 ENCOUNTER — Other Ambulatory Visit: Payer: Self-pay

## 2019-08-22 ENCOUNTER — Ambulatory Visit (LOCAL_COMMUNITY_HEALTH_CENTER): Payer: Self-pay

## 2019-08-22 VITALS — BP 103/65 | Ht 65.0 in | Wt 129.0 lb

## 2019-08-22 DIAGNOSIS — Z3201 Encounter for pregnancy test, result positive: Secondary | ICD-10-CM

## 2019-08-22 LAB — PREGNANCY, URINE: Preg Test, Ur: POSITIVE — AB

## 2019-08-22 MED ORDER — PRENATAL VITAMIN 27-0.8 MG PO TABS: 1.0000 | ORAL_TABLET | Freq: Every day | ORAL | 0 refills | Status: DC

## 2019-08-22 NOTE — Progress Notes (Signed)
Plans PNC at [facility]. Lynnette Warren, RN  

## 2019-09-19 ENCOUNTER — Other Ambulatory Visit: Payer: Self-pay

## 2019-09-19 ENCOUNTER — Ambulatory Visit (LOCAL_COMMUNITY_HEALTH_CENTER): Payer: Self-pay | Admitting: Physician Assistant

## 2019-09-19 ENCOUNTER — Encounter: Payer: Self-pay | Admitting: Physician Assistant

## 2019-09-19 VITALS — BP 128/75 | Ht 63.0 in | Wt 134.0 lb

## 2019-09-19 DIAGNOSIS — Z30017 Encounter for initial prescription of implantable subdermal contraceptive: Secondary | ICD-10-CM

## 2019-09-19 DIAGNOSIS — Z3009 Encounter for other general counseling and advice on contraception: Secondary | ICD-10-CM

## 2019-09-19 NOTE — Progress Notes (Signed)
Pt reports she is here for the Nexplanon insertion. Pt reports she had an abortion 09/07/2019.Lyman Speller, RN

## 2019-09-20 MED ORDER — ETONOGESTREL 68 MG ~~LOC~~ IMPL
68.0000 mg | DRUG_IMPLANT | Freq: Once | SUBCUTANEOUS | Status: AC
  Administered 2019-09-19: 14:00:00 68 mg via SUBCUTANEOUS

## 2019-09-20 NOTE — Progress Notes (Signed)
Nexplanon Insertion Procedure Patient identified, informed consent performed, consent signed.   Patient does understand that irregular bleeding is a very common side effect of this medication. She was advised to have backup contraception after placement. Patient was determined to meet WHO criteria for not being pregnant. Appropriate time out taken.  The insertion site was identified 8-10 cm (3-4 inches) from the medial epicondyle of the humerus and 3-5 cm (1.25-2 inches) posterior to (below) the sulcus (groove) between the biceps and triceps muscles of the patient's Left arm and marked.  Patient was prepped with alcohol swab and then injected with 3 ml of 1% lidocaine.  Arm was prepped with chlorhexidene, Nexplanon removed from packaging,  Device confirmed in needle, then inserted full length of needle and withdrawn per handbook instructions. Nexplanon was able to palpated in the patient's arm; patient palpated the insert herself. There was minimal blood loss.  Patient insertion site covered with guaze and a pressure bandage to reduce any bruising.  The patient tolerated the procedure well and was given post procedure instructions.   Nexplanon:   Counseled patient to take OTC analgesic starting as soon as lidocaine starts to wear off and take regularly for at least 48 hr to decrease discomfort.  Specifically to take with food or milk to decrease stomach upset and for IB 600 mg (3 tablets) every 6 hrs; IB 800 mg (4 tablets) every 8 hrs; or Aleve 2 tablets every 12 hrs.  Counseled patient re:  Risks, benefits, and SE of Depo vs Nexplanon.  Patient opts to get Nexplanon today.  Per chart, last RP 2018 and pap is overdue.  Counseled patient that she needs to RTC in 2-3 weeks for RP and pap.  Patient verbalizes understanding and states that she will call back to make that appointment.

## 2022-05-20 ENCOUNTER — Ambulatory Visit: Payer: Self-pay

## 2022-05-20 ENCOUNTER — Ambulatory Visit: Payer: Medicaid Other

## 2022-05-20 ENCOUNTER — Ambulatory Visit (LOCAL_COMMUNITY_HEALTH_CENTER): Payer: Medicaid Other | Admitting: Nurse Practitioner

## 2022-05-20 ENCOUNTER — Ambulatory Visit: Payer: Self-pay | Admitting: Nurse Practitioner

## 2022-05-20 ENCOUNTER — Encounter: Payer: Self-pay | Admitting: Oncology

## 2022-05-20 ENCOUNTER — Encounter: Payer: Self-pay | Admitting: Nurse Practitioner

## 2022-05-20 VITALS — BP 119/71 | Ht 63.0 in | Wt 155.0 lb

## 2022-05-20 DIAGNOSIS — Z3009 Encounter for other general counseling and advice on contraception: Secondary | ICD-10-CM

## 2022-05-20 DIAGNOSIS — Z113 Encounter for screening for infections with a predominantly sexual mode of transmission: Secondary | ICD-10-CM

## 2022-05-20 DIAGNOSIS — Z01419 Encounter for gynecological examination (general) (routine) without abnormal findings: Secondary | ICD-10-CM

## 2022-05-20 LAB — HEMOGLOBIN, FINGERSTICK: Hemoglobin: 12.1 g/dL (ref 11.1–15.9)

## 2022-05-20 LAB — WET PREP FOR TRICH, YEAST, CLUE
Trichomonas Exam: NEGATIVE
Yeast Exam: NEGATIVE

## 2022-05-20 NOTE — Progress Notes (Signed)
Wet prep and Hgb reviewed, no treatment indicated. Hgb=12.1.Marland KitchenBurt Knack, RN

## 2022-05-20 NOTE — Progress Notes (Signed)
Patient here for PE and Nexplanon removal. Last PE was PP at [facility] 11/17/2017. Last Pap was 04/07/2017, LSIL. Nexplanon inserted 09/19/2019. Interested in BTL at some time. Burt Knack, RN

## 2022-05-20 NOTE — Progress Notes (Signed)
See other encounter from today's visit.Burt Knack, RN

## 2022-05-20 NOTE — Progress Notes (Signed)
See other noted.  Glenna Fellows, FNP
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
## 2022-05-23 LAB — IGP, RFX APTIMA HPV ASCU: PAP Smear Comment: 0

## 2022-07-28 ENCOUNTER — Encounter: Payer: Self-pay | Admitting: Oncology

## 2022-07-29 ENCOUNTER — Encounter: Payer: Self-pay | Admitting: Oncology

## 2022-07-31 ENCOUNTER — Telehealth: Payer: Self-pay | Admitting: Physician Assistant

## 2022-07-31 DIAGNOSIS — F411 Generalized anxiety disorder: Secondary | ICD-10-CM

## 2022-07-31 MED ORDER — HYDROXYZINE PAMOATE 25 MG PO CAPS: 25.0000 mg | ORAL_CAPSULE | Freq: Three times a day (TID) | ORAL | 0 refills | Status: AC | PRN

## 2022-07-31 NOTE — Patient Instructions (Addendum)
  Silvano Bilis, thank you for joining Piedad Climes, PA-C for today's virtual visit.  While this provider is not your primary care provider (PCP), if your PCP is located in our provider database this encounter information will be shared with them immediately following your visit.   A [facility] MyChart account gives you access to today's visit and all your visits, tests, and labs performed at [facility] " click here if you don't have a [facility] MyChart account or go to [email or web address]  Consent: (Patient) Deborah Ferguson provided verbal consent for this virtual visit at the beginning of the encounter.  Current Medications:  Current Outpatient Medications:    medroxyPROGESTERone (DEPO-PROVERA) 150 MG/ML injection, Inject 1 mL (150 mg total) into the muscle every 3 (three) months. (Patient not taking: Reported on 08/22/2019), Disp: 1 mL, Rfl: 3   Multiple Vitamins-Minerals (MULTIVITAMIN WITH MINERALS) tablet, Take 1 tablet by mouth daily., Disp: , Rfl:    Prenatal Vit-Fe Fumarate-FA (PRENATAL VITAMIN) 27-0.8 MG TABS, Take 1 tablet by mouth daily at 6 (six) AM. (Patient not taking: Reported on 09/19/2019), Disp: 100 tablet, Rfl: 0   Medications ordered in this encounter:  No orders of the defined types were placed in this encounter.    *If you need refills on other medications prior to your next appointment, please contact your pharmacy*  Follow-Up: Call back or seek an in-person evaluation if the symptoms worsen or if the condition fails to improve as anticipated.  [facility] [phone]  Other Instructions Please look at downloading the calm or headspace app on your phone to work on mindfulness training and deep breathing for anxiety.  This can be very beneficial.  Use the link below to get scheduled with a new primary care provider.  Also, see the resources listed below for local counselors and therapist.  Use the hydroxyzine as  directed when needed for more acute anxiety or to help on nights where you cannot sleep.  This is a nonhabit-forming medication, but still considered a short term solution.  It is very important you get established with a primary care provider who can further assess, manage and monitor your anxiety.  You can look into registering your pet as an emotional support animal in the meantime by going to [email or web address]     If you have been instructed to have an in-person evaluation today at a local Urgent Care facility, please use the link below. It will take you to a list of all of our available [facility], including address, phone number and hours of operation. Please do not delay care.  [facility]  If you or a family member do not have a primary care provider, use the link below to schedule a visit and establish care. When you choose a [facility] primary care physician or advanced practice provider, you gain a long-term partner in health. Find a Primary Care Provider  Learn more about [facility]'s in-office and virtual care options: [facility] - Get Care Now
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
## 2022-08-04 ENCOUNTER — Ambulatory Visit: Payer: Self-pay

## 2023-11-22 ENCOUNTER — Emergency Department
Admission: EM | Admit: 2023-11-22 | Discharge: 2023-11-22 | Disposition: A | Discharge status: Home / Self Care | Payer: Self-pay | Attending: Student in an Organized Health Care Education/Training Program | Admitting: Student in an Organized Health Care Education/Training Program

## 2023-11-22 ENCOUNTER — Other Ambulatory Visit: Payer: Self-pay

## 2023-11-22 DIAGNOSIS — N764 Abscess of vulva: Secondary | ICD-10-CM | POA: Insufficient documentation

## 2023-11-22 MED ORDER — SULFAMETHOXAZOLE-TRIMETHOPRIM 800-160 MG PO TABS: 1.0000 | ORAL_TABLET | Freq: Two times a day (BID) | ORAL | 0 refills | Status: AC

## 2023-11-22 NOTE — ED Triage Notes (Signed)
 Pt to Ed via POV from home. Pt ambulatory to triage. Pt reports abscess on perineal area x3 days. Pt reports pain 8/10 and is throbbing. Pt reports unable to sit due to pain.

## 2023-11-22 NOTE — ED Notes (Signed)
 See triage note  Presents with possible abscess area to groin  Staes pain and swelling to left labia

## 2023-11-22 NOTE — Discharge Instructions (Signed)
 Please take the antibiotics as prescribed.  Sometimes antibiotics are not enough to treat an abscess and it does eventually need to be drained. Apply a warm compress to the area twice daily.  I have attached information for an OB/GYN.  Schedule follow-up appointment with them or return to the emergency department with worsening symptoms.

## 2023-11-22 NOTE — ED Provider Notes (Signed)
 [facility] Provider Note    Event Date/Time   First MD Initiated Contact with Patient 11/22/23 [phone]     (approximate)   History   Abscess   HPI  Deborah Ferguson is a 28 y.o. female with PMH of anemia who presents for evaluation of a labial abscess.  Patient states that it started as a small bump and has become progressively larger in the past 3 days.  She reports significant pain to the area and has had difficulty sitting down due to the pain.  She describes using an exfoliation scrub over the area to try to help with this was not effective.  No abnormal discharge. Patient does get waxes.       Physical Exam   Triage Vital Signs: ED Triage Vitals  Encounter Vitals Group     BP 11/22/23 0842 117/86     Systolic BP Percentile --      Diastolic BP Percentile --      Pulse Rate 11/22/23 0842 (!) 120     Resp 11/22/23 0842 20     Temp 11/22/23 0842 98.6 F (37 C)     Temp src --      SpO2 11/22/23 0842 98 %     Weight 11/22/23 0852 154 lb 15.7 oz (70.3 kg)     Height 11/22/23 0852 5\' 3"  (1.6 m)     Head Circumference --      Peak Flow --      Pain Score 11/22/23 0843 8     Pain Loc --      Pain Education --      Exclude from Growth Chart --     Most recent vital signs: Vitals:   11/22/23 0842  BP: 117/86  Pulse: (!) 120  Resp: 20  Temp: 98.6 F (37 C)  SpO2: 98%   General: Awake, uncomfortable on exam. CV:  Good peripheral perfusion.  Resp:  Normal effort.  Abd:  No distention.  Other:  Left labia majora is swollen when compared to the right, very tender to palpation, specifically in the fold where the labia meets the thigh there is an area of fluctuance approximately 1 cm long.  No swelling on the labia minora or over the Bartholin gland.   ED Results / Procedures / Treatments   Labs (all labs ordered are listed, but only abnormal results are displayed) Labs Reviewed - No data to display   PROCEDURES:  Critical Care  performed: No  Procedures   MEDICATIONS ORDERED IN ED: Medications - No data to display   IMPRESSION / MDM / ASSESSMENT AND PLAN / ED COURSE  I reviewed the triage vital signs and the nursing notes.                             28 year old female presents for evaluation of labial abscess.  Patient is tachycardic on presentation otherwise vital signs are stable.  Patient is very uncomfortable on exam.  Differential diagnosis includes, but is not limited to, labial abscess, Bartholin gland abscess, herpes, folliculitis, cellulitis.  Patient's presentation is most consistent with acute, uncomplicated illness.  Offered incision and drainage of the area of concern.  Patient preferred to try oral antibiotics instead.  I will give her follow-up with OB/GYN.  I explained that even after antibiotics sometimes these need to be drained.  We discussed using warm compresses to encourage it to drain on its own.  Patient voiced understanding, all questions were answered and she is stable at discharge.     FINAL CLINICAL IMPRESSION(S) / ED DIAGNOSES   Final diagnoses:  Abscess of labia majora     Rx / DC Orders   ED Discharge Orders          Ordered    sulfamethoxazole-trimethoprim (BACTRIM DS) 800-160 MG tablet  2 times daily        11/22/23 0954             Note:  This document was prepared using Dragon voice recognition software and may include unintentional dictation errors.   Cameron Ali, PA-C 11/22/23 6213    Willy Eddy, MD 11/22/23 1043

## 2024-08-03 ENCOUNTER — Ambulatory Visit (LOCAL_COMMUNITY_HEALTH_CENTER): Payer: Self-pay

## 2024-08-03 ENCOUNTER — Ambulatory Visit: Payer: Self-pay

## 2024-08-03 VITALS — BP 105/70 | HR 95 | Ht 61.0 in | Wt 159.8 lb

## 2024-08-03 DIAGNOSIS — R87612 Low grade squamous intraepithelial lesion on cytologic smear of cervix (LGSIL): Secondary | ICD-10-CM

## 2024-08-03 DIAGNOSIS — Z3046 Encounter for surveillance of implantable subdermal contraceptive: Secondary | ICD-10-CM

## 2024-08-03 DIAGNOSIS — Z3009 Encounter for other general counseling and advice on contraception: Secondary | ICD-10-CM

## 2024-08-03 MED ORDER — ETONOGESTREL 68 MG ~~LOC~~ IMPL
68.0000 mg | DRUG_IMPLANT | Freq: Once | SUBCUTANEOUS | Status: AC
  Administered 2024-08-03: 68 mg via SUBCUTANEOUS
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
## 2024-08-03 NOTE — Progress Notes (Signed)
 Pt is here for PE/Nexplanon  removal and  reinsertion. Expired nexplanon  removed  and a new inplant inserted successfully into the Lt arm by Damien Satchel, [facility] and pt tolerated well to the process with no complications. Opportunity given to pt to ask questions for any clarifications. Questions Answered. Condoms declined FP card given. Wilkie Drought, RN.
